# Patient Record
Sex: Female | Born: 1985 | Race: White | Hispanic: No | Marital: Single
Health system: Midwestern US, Community
[De-identification: ages and names within clinical notes are randomized; demographics above are authoritative.]

## PROBLEM LIST (undated history)

## (undated) DIAGNOSIS — Z789 Other specified health status: Secondary | ICD-10-CM

## (undated) DIAGNOSIS — F988 Other specified behavioral and emotional disorders with onset usually occurring in childhood and adolescence: Secondary | ICD-10-CM

## (undated) HISTORY — PX: UMBILICAL HERNIA REPAIR: SHX196

---

## 2004-04-02 ENCOUNTER — Emergency Department (HOSPITAL_COMMUNITY): Admission: EM | Admit: 2004-04-02 | Discharge: 2004-04-02 | Payer: Self-pay | Admitting: Emergency Medicine

## 2006-09-26 IMAGING — CR DG CHEST 2V
2 series · 2 of 2 positions shown · non-contrast
Comparison: none

CLINICAL DATA: Chest pain.
 CHEST (TWO VIEWS)

 The heart size and mediastinal contours are normal. The lungs are clear. The visualized skeleton is unremarkable.

[view not recorded (1 of 2)]
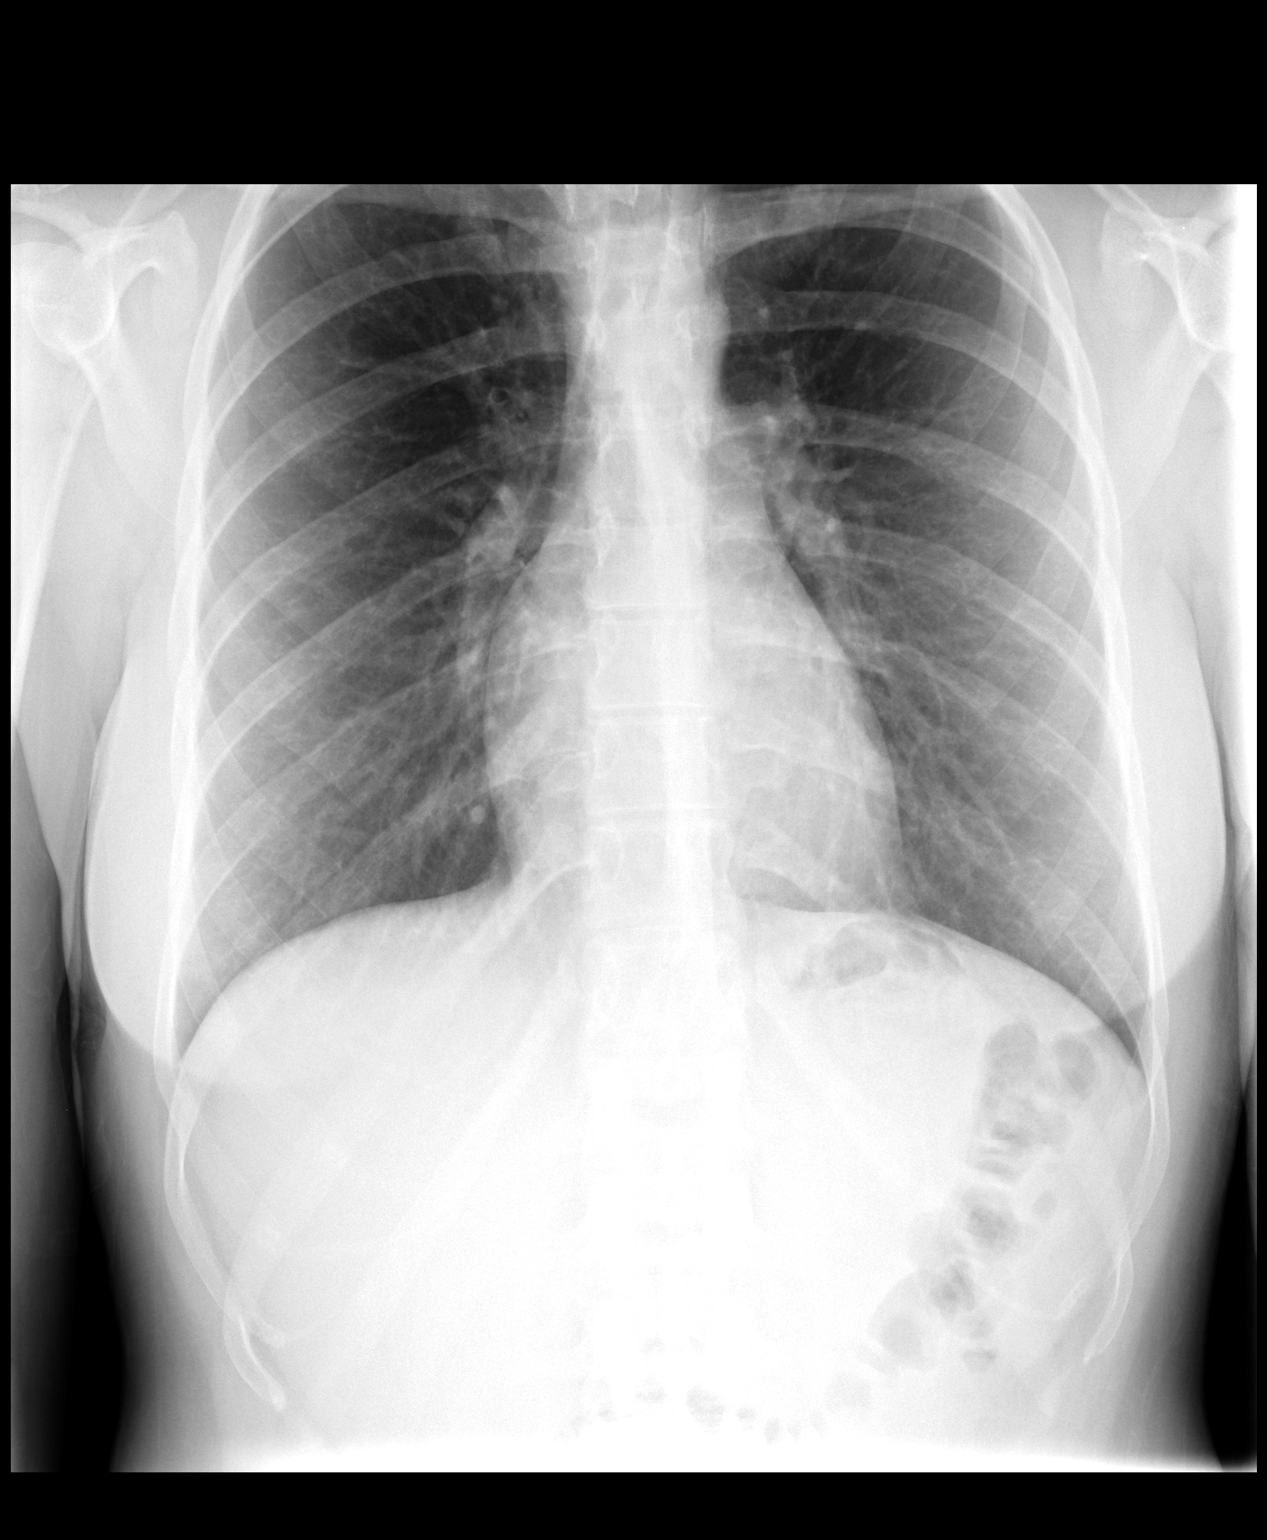

[view not recorded (2 of 2)]
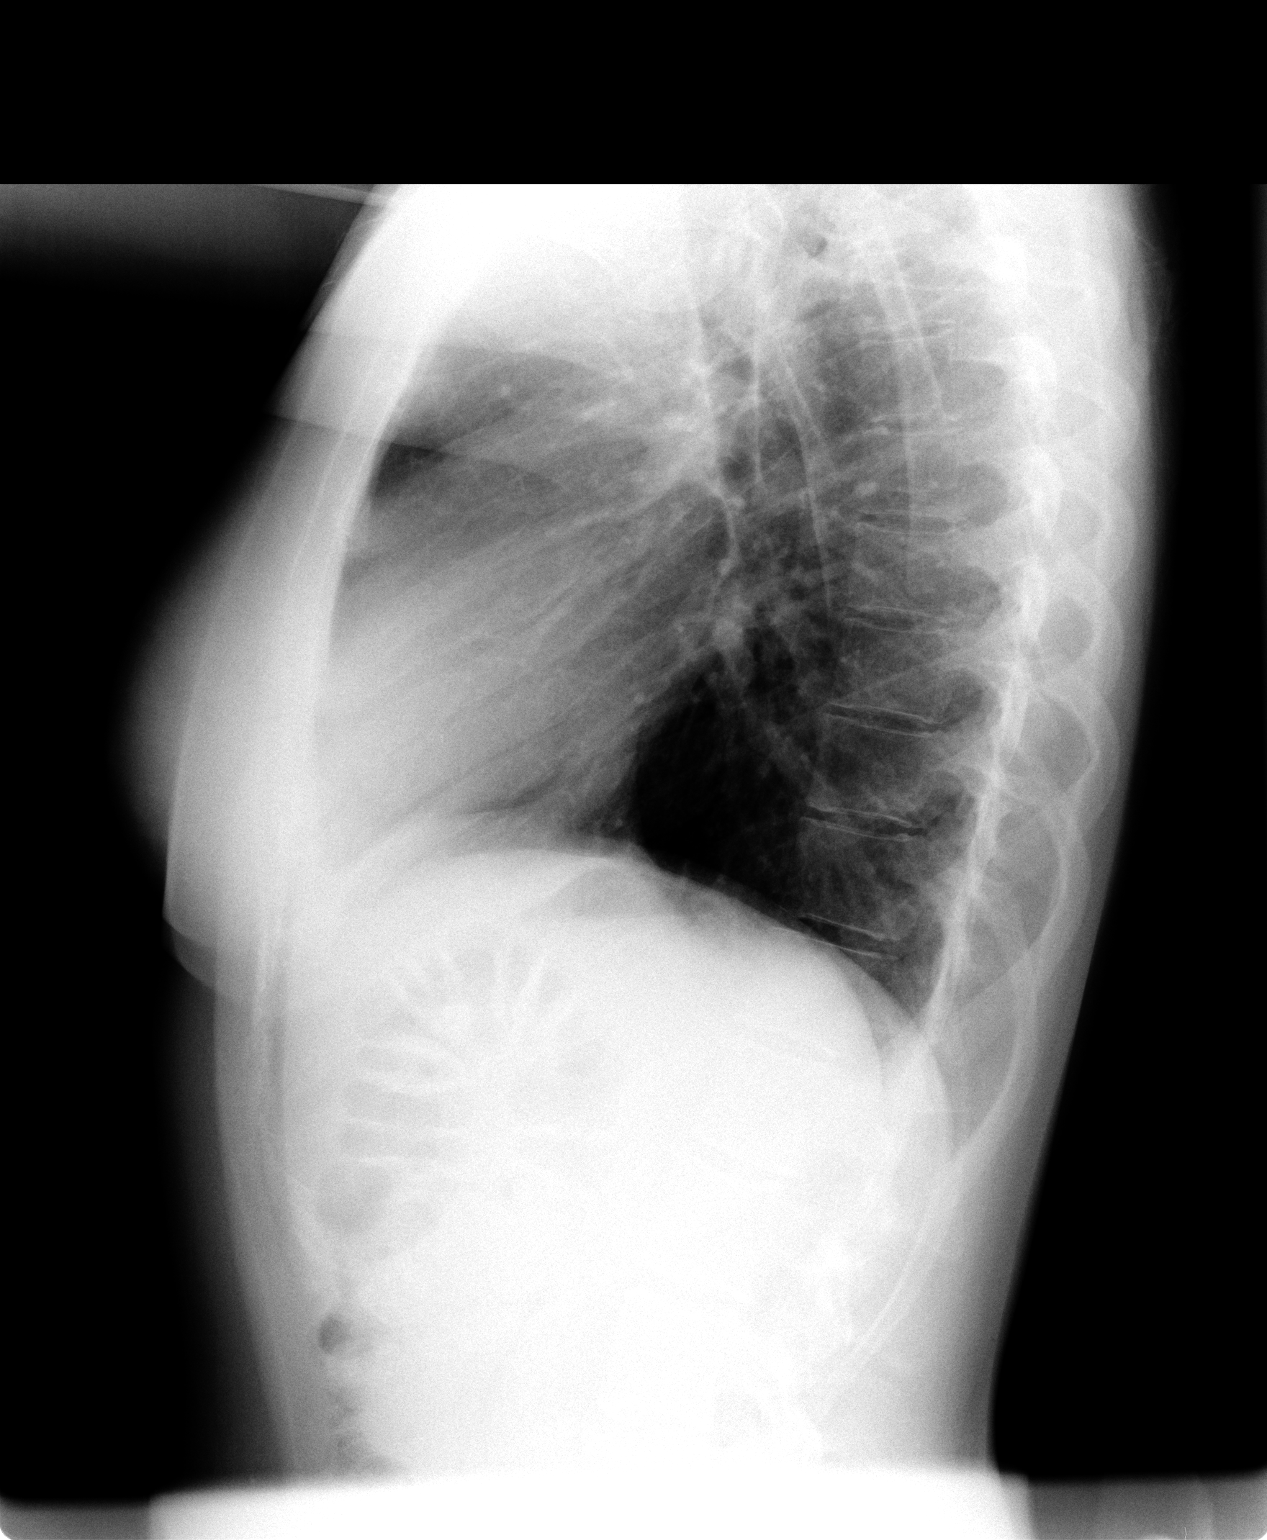

[2 of 2 positions shown; findings below may reference images not displayed]

IMPRESSION: No active disease.

## 2014-08-10 ENCOUNTER — Ambulatory Visit: Admit: 2014-08-10 | Discharge: 2014-08-10 | Payer: PRIVATE HEALTH INSURANCE | Attending: Family | Primary: Family

## 2014-08-10 DIAGNOSIS — F988 Other specified behavioral and emotional disorders with onset usually occurring in childhood and adolescence: Secondary | ICD-10-CM

## 2014-08-10 MED ORDER — AMPHETAMINE-DEXTROAMPHETAMINE 10 MG TAB
10 mg | ORAL_TABLET | Freq: Every day | ORAL | Status: DC
Start: 2014-08-10 — End: 2014-08-10

## 2014-08-10 MED ORDER — AMPHETAMINE-DEXTROAMPHETAMINE 10 MG TAB
10 mg | ORAL_TABLET | Freq: Every day | ORAL | Status: DC
Start: 2014-08-10 — End: 2014-11-16

## 2014-08-10 NOTE — Patient Instructions (Signed)
Attention Deficit Hyperactivity Disorder (ADHD) in Adults: Care Instructions  Your Care Instructions  Attention deficit hyperactivity disorder, or ADHD, is a condition that makes it hard to pay attention. So you may have problems when you try to focus, get organized, and finish tasks. It might make you more active than other people. Or you might do things without thinking first.  ADHD is very common. It usually starts in early childhood. Many adults don't realize they have it until their children are diagnosed. Then they become aware of their own symptoms.  Doctors don't know what causes ADHD. But it often runs in families.  ADHD can be treated with medicines, behavior training, and counseling. Treatment can improve your life.  Follow-up care is a key part of your treatment and safety. Be sure to make and go to all appointments, and call your doctor if you are having problems. It's also a good idea to know your test results and keep a list of the medicines you take.  How can you care for yourself at home?  ?? Learn all you can about ADHD. This will help you and your family understand it better.  ?? Take your medicines exactly as prescribed. Call your doctor if you think you are having a problem with your medicine. You will get more details on the specific medicines your doctor prescribes.  ?? If you miss a dose of your medicine, do not take an extra dose.  ?? If your doctor suggests counseling, find a counselor you like and trust. Talk openly and honestly. Be willing to make some changes.  ?? Find a support group for adults with ADHD. Talking to others with the same problems can help you feel better. It can also give you ideas about how to best cope with the condition.  ?? Get rid of distractions at your work space. Keep your desk clean. Try not to face a window or busy hallway.  ?? Use files, planners, and other tools to keep you organized.  ?? Limit use of alcohol, and do not use illegal drugs. People with ADHD  tend to become addicted more easily than others. Tell your doctor if you need help to quit. Counseling, support groups, and sometimes medicines can help you stay free of alcohol or drugs.  ?? Get at least 30 minutes of physical activity on most days of the week. Exercise has been shown to help people cope with ADHD. Walking is a good choice. You also may want to do other activities, such as running, swimming, cycling, or playing tennis or team sports.  When should you call for help?  Watch closely for changes in your health, and be sure to contact your doctor if:  ?? You feel sad a lot or cry all the time.  ?? You have trouble sleeping, or you sleep too much.  ?? You find it hard to concentrate, make decisions, or remember things.  ?? You change how you normally eat.  ?? You feel guilty for no reason.  Where can you learn more?  Go to http://www.healthwise.net/GoodHelpConnections  Enter B196 in the search box to learn more about "Attention Deficit Hyperactivity Disorder (ADHD) in Adults: Care Instructions."  ?? 2006-2016 [employer]. Care instructions adapted under license by [employer] (which disclaims liability or warranty for this information). This care instruction is for use with your licensed healthcare professional. If you have questions about a medical condition or this instruction, always ask your healthcare professional. Healthwise, Incorporated disclaims any warranty   or liability for your use of this information.  Content Version: 10.9.538570; Current as of: January 01, 2014

## 2014-08-11 NOTE — Progress Notes (Signed)
Chief Complaint   Patient presents with   ??? Establish Care     r#2   ??? Other     re start adderall       HPI:  Kerri Burton is a 29 y.o. female presenting to the office today for establishment. States she is doing well. 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 She recently moved back to the area after being in [place] for the past year. She has ADD and has been off of her medication for about 3 months. States she was diagnosed in highschool. She has issues with concentration and focus at work. She has issues remaining task oriented. Has been written up at work for forgetting to complete tasks. She denies having any medication side effects in the past. She denies shortness of breath, chest pain and palpitations. No irritability or insomnia. No dry mouth. No weight loss. She is without complaints today.     History reviewed. No pertinent past medical history.    History     Social History   ??? Marital Status: SINGLE     Spouse Name: N/A   ??? Number of Children: N/A   ??? 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 Smoking status: Never Smoker    ??? Smokeless tobacco: Never Used   ??? Alcohol Use: Yes      Comment: occ   ??? Drug Use: No   ??? Sexual Activity:     Partners: Male     Other Topics Concern   ??? 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        Family History   Problem Relation Age of Onset   ??? No Known Problems Mother    ??? Hypertension Father    ??? No Known Problems Sister    ??? No Known Problems Brother        Current Outpatient Prescriptions   Medication Sig Dispense Refill   ??? ibuprofen 100 mg tablet Take 100 mg by mouth every six (6) hours as needed for Pain.     ??? [START ON 10/10/2014] dextroamphetamine-amphetamine (ADDERALL) 10 mg tablet Take 1 Tab by mouth daily. Max Daily Amount: 10 mg. 30 Tab 0       Review of Systems - History obtained from the patient   General ROS: negative for - fatigue or fever  Psychological ROS: positive for - concentration difficulties  negative for - anxiety, depression or sleep disturbances  Endocrine ROS: negative for - malaise/lethargy or polydipsia/polyuria  Respiratory ROS: no cough, shortness of breath, or wheezing  Cardiovascular ROS: no chest pain or dyspnea on exertion  Gastrointestinal ROS: no abdominal pain, change in bowel habits, or black or bloody stools  Genito-Urinary ROS: no dysuria, trouble voiding, or hematuria  Musculoskeletal ROS: negative for - gait disturbance, muscle pain or muscular weakness  Neurological ROS: no TIA or stroke symptoms  Dermatological ROS: negative for - skin lesion changes    Visit Vitals   Item Reading   ??? BP 90/64 mmHg   ??? Temp(Src) 97.7 ??F (36.5 ??C) (Tympanic)   ??? Ht 5' 3.5" (1.613 m)   ??? Wt 152 lb (68.947 kg)   ??? BMI 26.50 kg/m2       Physical Examination: General appearance - alert, well appearing, and in no distress  Mental status - alert, oriented to person, place, and time, normal mood, behavior, speech, dress, motor activity, and thought processes  Neck - supple, no significant adenopathy  Chest - clear to auscultation, no wheezes, rales or rhonchi, symmetric air entry  Heart - normal rate, regular rhythm, normal S1, S2, no murmurs, rubs, clicks or gallops  Extremities - no pedal edema noted  Skin - normal coloration and turgor, no rashes, no suspicious skin lesions noted    Assessment and Plan:    1. ADD (attention deficit disorder)  Side effects reviewed in detail. Reminded about the possible side effects including but not limited to: anxiety, heart rhythm changes, insomnia, weight loss and memory changes. Patient was further reminded about the potentially addictive nature of the medications and that if any abuse or misuse/misappropriation were to be discovered that this would be grounds for discharge from the office.  Finally, patient understands need for  routine follow up and routine drug testing as needed.   - dextroamphetamine-amphetamine (ADDERALL) 10 mg tablet; Take 1 Tab by mouth daily. Max Daily Amount: 10 mg.  Dispense: 30 Tab; Refill: 0    2. BMI 26.0-26.9,adult  Long discussion with patient today regarding diet and exercise. Goal weight for patient is 130lbs.     3. Amenorrhea  States long history, rarely has cycles. Usually once a year. Has monthly bloating, cramping and moodiness. Uses condoms for birth control. States was told she may have PCOS and/or Endometriosis but was never confirmed. Patient wishes to follow up for labs/referral.       Myer HaffAyesha S Hinton-Nutt, NP

## 2014-11-16 ENCOUNTER — Ambulatory Visit: Admit: 2014-11-16 | Discharge: 2014-11-16 | Payer: BLUE CROSS/BLUE SHIELD | Attending: Family | Primary: Family

## 2014-11-16 DIAGNOSIS — F988 Other specified behavioral and emotional disorders with onset usually occurring in childhood and adolescence: Secondary | ICD-10-CM

## 2014-11-16 LAB — AMB POC URINE PREGNANCY TEST, VISUAL COLOR COMPARISON: HCG urine, Ql. (POC): NEGATIVE

## 2014-11-16 MED ORDER — AMPHETAMINE-DEXTROAMPHETAMINE 10 MG TAB
10 mg | ORAL_TABLET | Freq: Every day | ORAL | 0 refills | Status: DC
Start: 2014-11-16 — End: 2014-11-23

## 2014-11-16 MED ORDER — AMPHETAMINE-DEXTROAMPHETAMINE 10 MG TAB
10 mg | ORAL_TABLET | Freq: Every day | ORAL | 0 refills | Status: DC
Start: 2014-11-16 — End: 2014-11-16

## 2014-11-16 NOTE — Progress Notes (Signed)
Chief Complaint   Patient presents with   ??? Medication Refill     adderall r#2       HPI:  Kerri Burton is a 29 y.o. female presenting to the office today for follow up. She had ADD. States she is doing well. Feels that her symptoms have improved once she was back on her medication. States she is taking her medication regularly as directed. She is able to stay on task and focused at work. She is able to accomplish her work in a timely manner. She denies any medication side effects. No agitation or irritability. No insomnia. No anxiety. No shortness of breath, chest pain or palpitations. She has lost 4 pounds since her last visit as she has been exercising. Also, she continues to not have a period. Unsure of last cycle, at least before her last office visit 3 months ago. Reports this has been an issue for a very long time. She is sexually active and does not use contraceptive. We will recheck urine hcg today. We will send her back to GYN for further evaluation and testing. She is also in need of GYN exam. She denies issues or complaints today.     History reviewed. No pertinent past medical history.    Social History     Social History   ??? Marital status: SINGLE     Spouse name: N/A   ??? Number of children: N/A   ??? 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 Smoking status: Never Smoker   ??? Smokeless tobacco: Never Used   ??? Alcohol use 2.4 oz/week     3 Glasses of wine, 0 Cans of beer, 1 Shots of liquor per week      Comment: occ   ??? Drug use: No   ??? Sexual activity: Yes     Partners: Male     Birth control/ protection: None     Other Topics Concern   ??? 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        Family History   Problem Relation Age of Onset   ??? No Known Problems Mother    ??? Hypertension Father    ??? No Known Problems Sister    ??? No Known Problems Brother        Current Outpatient Prescriptions    Medication Sig Dispense Refill   ??? [START ON 01/20/2015] dextroamphetamine-amphetamine (ADDERALL) 10 mg tablet Take 1 Tab by mouth daily. Max Daily Amount: 10 mg. 30 Tab 0   ??? ibuprofen 100 mg tablet Take 100 mg by mouth every six (6) hours as needed for Pain.         Review of Systems - see HPI    Visit Vitals   ??? BP 98/62   ??? Temp 96.2 ??F (35.7 ??C) (Tympanic)   ??? Ht 5' 3.5" (1.613 m)   ??? Wt 148 lb (67.1 kg)   ??? BMI 25.81 kg/m2       Physical Examination: General appearance - alert, well appearing, and in no distress  Mental status - normal mood, behavior, speech, dress, motor activity, and thought processes  Neck - supple, no significant adenopathy  Chest - clear to auscultation, no wheezes, rales or rhonchi, symmetric air entry  Heart - normal rate, regular rhythm, normal S1, S2, no  murmurs, rubs, clicks or gallops  Neurological - alert, oriented, normal speech, no focal findings or movement disorder noted  Extremities - no pedal edema noted  Skin - normal coloration and turgor    Assessment and Plan:    1. ADD (attention deficit disorder)  Appears to be satisfactorily responding to the treatment. No apparent side effects.  Reminded about the possible side effects including but not limited to: anxiety, heart rhythm changes, insomnia, weight loss and memory changes. Patient was further reminded about the potentially addictive nature of the medications and that if any abuse or misuse/misappropriation were to be discovered that this would be grounds for discharge from the office.  Finally, patient understands need for routine follow up.    - dextroamphetamine-amphetamine (ADDERALL) 10 mg tablet; Take 1 Tab by mouth daily. Max Daily Amount: 10 mg.  Dispense: 30 Tab; Refill: 0    Provider Statement: To the best of my knowledge, patient is taking controlled medications properly and not abusing them.     2. Amenorrhea  - Pregnancy Urine (16109)  - REFERRAL TO GYNECOLOGY      Myer Haff, NP

## 2014-11-16 NOTE — Patient Instructions (Addendum)
Secondary Amenorrhea: Care Instructions  Your Care Instructions  Amenorrhea means you do not have menstrual periods. There are two types. Primary amenorrhea means you never start your periods. Secondary amenorrhea means you have had periods, and then they stop, especially for more than 3 months.  Even if you don't have periods, you could still get pregnant.  You may not know what caused your periods to stop. Possible causes include pregnancy, hormonal changes, and losing or gaining a lot of weight quickly. Some medicines and stress could also cause it.  Being active in endurance sports can also cause you to miss your period or stop menstruating. Female athletes may try to lose or maintain weight in harmful ways. These include dieting too much or binging and purging. But doing these things can lead to eating disorders, amenorrhea, and osteoporosis. If you exercise less or gain a little weight, your periods will probably start again.  Your doctor may order tests to find out why your periods have stopped. Your doctor may give you the hormone progestin. It can cause you to have a period.  Talk to your doctor if you do not have a period for 3 months or more. Going for a long amount of time without a period can raise your chance of getting cancer of the lining of the uterus later in life.  Follow-up care is a key part of your treatment and safety. Be sure to make and go to all appointments, and call your doctor if you are having problems. It's also a good idea to know your test results and keep a list of the medicines you take.  How can you care for yourself at home?  ?? Eat a healthy, balanced diet. This includes fruits, vegetables, whole grains, proteins, and low-fat dairy products.  ?? Do light exercise, unless your doctor told you not to exercise.  ?? Use birth control if you do not want to get pregnant.  When should you call for help?  Watch closely for changes in your health, and be sure to contact your doctor if:   ?? You think you might be pregnant.  ?? You have very heavy bleeding after not having had your period for several months.  Where can you learn more?  Go to http://www.healthwise.net/GoodHelpConnections  Enter E177 in the search box to learn more about "Secondary Amenorrhea: Care Instructions."  ?? 2006-2016 [employer]. Care instructions adapted under license by [employer] (which disclaims liability or warranty for this information). This care instruction is for use with your licensed healthcare professional. If you have questions about a medical condition or this instruction, always ask your healthcare professional. [employer]wise, Inc[employer] disclaims any warranty or liability for your use of this information.  Content Version: 11.0.578772; Current as of: April 08, 2014

## 2014-11-23 ENCOUNTER — Telehealth

## 2014-11-23 MED ORDER — AMPHETAMINE-DEXTROAMPHETAMINE 12.5 MG TAB
12.5 mg | ORAL_TABLET | Freq: Every day | ORAL | 0 refills | Status: DC
Start: 2014-11-23 — End: 2014-11-23

## 2014-11-23 MED ORDER — AMPHETAMINE-DEXTROAMPHETAMINE 12.5 MG TAB
12.5 mg | ORAL_TABLET | Freq: Every day | ORAL | 0 refills | Status: DC
Start: 2014-11-23 — End: 2015-01-03

## 2014-11-23 NOTE — Telephone Encounter (Signed)
Let's try 12.5 mg which is the next step up. That way we are staying with the lowest effective dose. Please let us know how she is responding to this medication and we can go form there.

## 2014-11-23 NOTE — Telephone Encounter (Signed)
Left message with below info

## 2014-11-23 NOTE — Telephone Encounter (Signed)
lvm that you saw her last week and you told her that she could increase her adderall dose to 15 mg from 10 mg to see if it helped.  Pt states that it did help and she would a 15 mg prescription.  She dropped off her 10 mg prescriptions up front

## 2014-11-29 ENCOUNTER — Encounter: Attending: Obstetrics & Gynecology | Primary: Family

## 2014-12-31 NOTE — Progress Notes (Signed)
Yes. Thank you

## 2014-12-31 NOTE — Progress Notes (Signed)
Per chart patient NOS her appointment with [facility].  Can I close referral?

## 2015-01-03 ENCOUNTER — Encounter

## 2015-01-03 MED ORDER — AMPHETAMINE-DEXTROAMPHETAMINE 12.5 MG TAB
12.5 mg | ORAL_TABLET | Freq: Every day | ORAL | 0 refills | Status: DC
Start: 2015-01-03 — End: 2015-01-03

## 2015-01-03 MED ORDER — AMPHETAMINE-DEXTROAMPHETAMINE 12.5 MG TAB
12.5 mg | ORAL_TABLET | Freq: Every day | ORAL | 0 refills | Status: DC
Start: 2015-01-03 — End: 2015-03-30

## 2015-01-03 NOTE — Telephone Encounter (Signed)
lvm that she needs a refill on her 12.5mg  adderall

## 2015-01-03 NOTE — Telephone Encounter (Signed)
RX printed x 2 and post dated. OK to pick up.

## 2015-01-04 NOTE — Telephone Encounter (Signed)
Pt notified and voiced understanding

## 2015-02-18 ENCOUNTER — Encounter: Attending: Family | Primary: Family

## 2015-03-30 ENCOUNTER — Ambulatory Visit: Admit: 2015-03-30 | Discharge: 2015-03-30 | Payer: BLUE CROSS/BLUE SHIELD | Attending: Family | Primary: Family

## 2015-03-30 DIAGNOSIS — F988 Other specified behavioral and emotional disorders with onset usually occurring in childhood and adolescence: Secondary | ICD-10-CM

## 2015-03-30 MED ORDER — AMPHETAMINE-DEXTROAMPHETAMINE 15 MG TAB
15 mg | ORAL_TABLET | Freq: Every day | ORAL | 0 refills | Status: DC
Start: 2015-03-30 — End: 2015-09-02

## 2015-03-30 MED ORDER — AMPHETAMINE-DEXTROAMPHETAMINE 15 MG TAB
15 mg | ORAL_TABLET | Freq: Every day | ORAL | 0 refills | Status: DC
Start: 2015-03-30 — End: 2015-03-30

## 2015-03-30 NOTE — Progress Notes (Signed)
Chief Complaint   Patient presents with   ??? Other     consult on adderall.  r#2       HPI:  Kerri Burton is a 30 y.o. female presenting to the office today for follow up. States she is doing well. She had ADD. She is taking medication as directed without issue or side effect. She has increased focus and concentration. She does take drug holidays occasionally on the weekend. She denies any medication side effects. No shortness of breath, chest pain or palpitations. No agitation or irritability. No dry mouth. No insomnia. No weight loss. She would like to go to  tablets (currently 12.5mg ) due to pharmacy availability. States she has issues getting RX filled.     History reviewed. No pertinent past medical history.    Social History     Social History   ??? Marital status: SINGLE     Spouse name: N/A   ??? Number of children: N/A   ??? 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 Smoking status: Never Smoker   ??? Smokeless tobacco: Never Used   ??? Alcohol use 2.4 oz/week     3 Glasses of wine, 0 Cans of beer, 1 Shots of liquor per week      Comment: occ   ??? Drug use: No   ??? Sexual activity: Yes     Partners: Male     Birth control/ protection: None     Other Topics Concern   ??? 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        Family History   Problem Relation Age of Onset   ??? No Known Problems Mother    ??? Hypertension Father    ??? No Known Problems Sister    ??? No Known Problems Brother        Current Outpatient Prescriptions   Medication Sig Dispense Refill   ??? [START ON 05/28/2015] dextroamphetamine-amphetamine (ADDERALL) 15 mg tablet Take 1 Tab (15 mg total) by mouth dailyEarliest Fill Date: 05/28/15.  Max Daily Amount: 15 mg 30 Tab 0   ??? ibuprofen 100 mg tablet Take 100 mg by mouth every six (6) hours as needed for Pain.       Review of Systems - History obtained from the patient   General ROS: negative for - fatigue, sleep disturbance, weight gain or weight loss  Psychological ROS: positive for - concentration difficulties  negative for - anxiety, depression, sleep disturbances or suicidal ideation  Endocrine ROS: negative for - polydipsia/polyuria  Respiratory ROS: no cough, shortness of breath, or wheezing  Cardiovascular ROS: no chest pain or dyspnea on exertion    Visit Vitals   ??? BP 98/62   ??? Temp 98.4 ??F (36.9 ??C) (Tympanic)   ??? Ht 5' 3.5" (1.613 m)   ??? Wt 147 lb (66.7 kg)   ??? BMI 25.63 kg/m2       Physical Examination: General appearance - alert, well appearing, and in no distress  Mental status - alert, oriented to person, place, and time, normal mood, behavior, speech, dress, motor activity, and thought processes  Neck - supple, no significant adenopathy  Chest - clear to auscultation, no wheezes, rales or rhonchi, symmetric air entry  Heart - normal rate, regular rhythm, normal S1, S2, no murmurs, rubs, clicks or  gallops  Neurological - alert, oriented, normal speech, no focal findings or movement disorder noted  Extremities - no pedal edema noted  Skin - normal coloration and turgor, no rashes, no suspicious skin lesions noted    Assessment and Plan:    1. ADD (attention deficit disorder)  Appears to be satisfactorily responding to the treatment. No apparent side effects.  Reminded about the possible side effects including but not limited to: anxiety, heart rhythm changes, insomnia, weight loss and memory changes. Patient was further reminded about the potentially addictive nature of the medications and that if any abuse or misuse/misappropriation were to be discovered that this would be grounds for discharge from the office.  Finally, patient understands need for routine follow up.  [facility] reviewed. Last filled 02/18/2015. To the best of my knowledge, patient is taking controlled medications properly and not abusing them. RX printed x 3 and post dated 03/30/2015, 04/27/2015, and  05/28/2015.   - dextroamphetamine-amphetamine (ADDERALL) 15 mg tablet; Take 1 Tab (15 mg total) by mouth dailyEarliest Fill Date: 05/28/15.  Max Daily Amount: 15 mg  Dispense: 30 Tab; Refill: 0      Myer Haff, NP

## 2015-03-30 NOTE — Patient Instructions (Addendum)
Polycystic Ovary Syndrome: Care Instructions  Your Care Instructions  Polycystic ovary syndrome, or PCOS, means a woman's hormones are out of balance. It can cause problems with your periods and make it hard to get pregnant.  Doctors don't know for sure what causes PCOS, but it seems to run in families. It also seems to be linked to obesity and a risk for diabetes. If you have PCOS, your sisters and daughters have a higher chance of getting it too.  You may have other symptoms. These include weight gain, acne, too much hair growth on the face or body, high blood pressure, and high blood sugar. Your ovaries may have cysts on them. These cysts are growths filled with fluid.  Keep in mind that although you may not have regular periods, you can still get pregnant. Talk to your doctor about birth control if you do not want to get pregnant. Sometimes the hormone changes with PCOS can also make it hard for some women to get pregnant. If this is a concern, talk to your doctor about treatment for this problem.  Women who have PCOS can go for months or longer with no period. Your doctor may recommend medicines that can help get your cycles back to normal.  Follow-up care is a key part of your treatment and safety. Be sure to make and go to all appointments, and call your doctor if you are having problems. It's also a good idea to know your test results and keep a list of the medicines you take.  How can you care for yourself at home?  ?? Take your medicines exactly as prescribed. Call your doctor if you think you are having a problem with your medicine.  ?? Eat a healthy diet. Include fruits, vegetables, beans, and whole grains in your diet each day.  ?? If you are overweight, losing weight can help with many of the symptoms of PCOS. Talk to your doctor about safe ways to lose weight.  ?? Get at least 30 minutes of exercise on most days of the week. Walking is  a good choice. Or you can run, swim, cycle, or play tennis or team sports.  ?? For hair growth you don't want, try bleaching, plucking, electrolysis, or laser therapy.  ?? Acne can be treated with over-the-counter medicines. Look for ones that have benzoyl peroxide or salicylic acid in them.  When should you call for help?  Call your doctor now or seek immediate medical care if:  ?? You have severe vaginal bleeding. This means that you are soaking through your usual pads or tampons each hour for 2 or more hours.  Watch closely for changes in your health, and be sure to contact your doctor if:  ?? You have more vaginal bleeding, or bleeding is more irregular.  Where can you learn more?  Go to http://www.healthwise.net/GoodHelpConnections.  Enter K559 in the search box to learn more about "Polycystic Ovary Syndrome: Care Instructions."  Current as of: April 08, 2014  Content Version: 11.1  ?? 2006-2016 [employer]. Care instructions adapted under license by [employer] (which disclaims liability or warranty for this information). If you have questions about a medical condition or this instruction, always ask your healthcare professional. [employer], [employer] disclaims any warranty or liability for your use of this information.

## 2015-09-02 ENCOUNTER — Encounter: Attending: Family | Primary: Family

## 2015-09-02 ENCOUNTER — Ambulatory Visit: Admit: 2015-09-02 | Discharge: 2015-09-02 | Attending: Family | Primary: Family

## 2015-09-02 DIAGNOSIS — F988 Other specified behavioral and emotional disorders with onset usually occurring in childhood and adolescence: Secondary | ICD-10-CM

## 2015-09-02 MED ORDER — AMPHETAMINE-DEXTROAMPHETAMINE 15 MG TAB
15 mg | ORAL_TABLET | Freq: Every day | ORAL | 0 refills | Status: DC
Start: 2015-09-02 — End: 2015-10-11

## 2015-09-02 NOTE — Patient Instructions (Addendum)
Attention Deficit Hyperactivity Disorder (ADHD) in Adults: Care Instructions  Your Care Instructions  Attention deficit hyperactivity disorder, or ADHD, is a condition that makes it hard to pay attention. So you may have problems when you try to focus, get organized, and finish tasks. It might make you more active than other people. Or you might do things without thinking first.  ADHD is very common. It usually starts in early childhood. Many adults don't realize they have it until their children are diagnosed. Then they become aware of their own symptoms.  Doctors don't know what causes ADHD. But it often runs in families.  ADHD can be treated with medicines, behavior training, and counseling. Treatment can improve your life.  Follow-up care is a key part of your treatment and safety. Be sure to make and go to all appointments, and call your doctor if you are having problems. It's also a good idea to know your test results and keep a list of the medicines you take.  How can you care for yourself at home?  ?? Learn all you can about ADHD. This will help you and your family understand it better.  ?? Take your medicines exactly as prescribed. Call your doctor if you think you are having a problem with your medicine. You will get more details on the specific medicines your doctor prescribes.  ?? If you miss a dose of your medicine, do not take an extra dose.  ?? If your doctor suggests counseling, find a counselor you like and trust. Talk openly and honestly. Be willing to make some changes.  ?? Find a support group for adults with ADHD. Talking to others with the same problems can help you feel better. It can also give you ideas about how to best cope with the condition.  ?? Get rid of distractions at your work space. Keep your desk clean. Try not to face a window or busy hallway.  ?? Use files, planners, and other tools to keep you organized.  ?? Limit use of alcohol, and do not use illegal drugs. People with ADHD  tend to become addicted more easily than others. Tell your doctor if you need help to quit. Counseling, support groups, and sometimes medicines can help you stay free of alcohol or drugs.  ?? Get at least 30 minutes of physical activity on most days of the week. Exercise has been shown to help people cope with ADHD. Walking is a good choice. You also may want to do other activities, such as running, swimming, cycling, or playing tennis or team sports.  When should you call for help?  Watch closely for changes in your health, and be sure to contact your doctor if:  ?? You feel sad a lot or cry all the time.  ?? You have trouble sleeping, or you sleep too much.  ?? You find it hard to concentrate, make decisions, or remember things.  ?? You change how you normally eat.  ?? You feel guilty for no reason.  Where can you learn more?  Go to http://www.healthwise.net/[employer].  Enter B196 in the search box to learn more about "Attention Deficit Hyperactivity Disorder (ADHD) in Adults: Care Instructions."  Current as of: September 07, 2014  Content Version: 11.3  ?? 2006-2017 Healthwise[employer] Inc[employer]. Care instructions adapted under license by [employer] (which disclaims liability or warranty for this information). If you have questions about a medical condition or this instruction, always ask your healthcare professional. Healthwise, Incorporated disclaims any warranty   or liability for your use of this information.

## 2015-09-02 NOTE — Progress Notes (Signed)
Chief Complaint   Patient presents with   ??? Follow-up     ADD.  r#2       HPI:  Kerri Burton is a 30 y.o. female presenting to the office today for follow up. States she is doing well. She has ADD. States she takes medication as directed. She does not take medication on weekends and holidays. States symptoms are well controlled. She does have increased focus, concentration and productivity at work. She reports being able to stay on task. She denies any medication side effects. No shortness of breath, chest pain or palpitations. No agitation or irritability. No insomnia. No anxiety. She complains of an afternoon "crash" around 4-5 pm. Discussed alternate options such as XR and she will think about it. She is without complaints.     History reviewed. No pertinent past medical history.    Social History     Social History   ??? Marital status: SINGLE     Spouse name: N/A   ??? Number of children: N/A   ??? 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 Smoking status: Never Smoker   ??? Smokeless tobacco: Never Used   ??? Alcohol use 2.4 oz/week     3 Glasses of wine, 0 Cans of beer, 1 Shots of liquor per week      Comment: occ   ??? Drug use: No   ??? Sexual activity: Yes     Partners: Male     Birth control/ protection: None     Other Topics Concern   ??? 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        Family History   Problem Relation Age of Onset   ??? No Known Problems Mother    ??? Hypertension Father    ??? No Known Problems Sister    ??? No Known Problems Brother        Current Outpatient Prescriptions   Medication Sig Dispense Refill   ??? dextroamphetamine-amphetamine (ADDERALL) 15 mg tablet Take 1 Tab (15 mg total) by mouth daily.  Max Daily Amount: 15 mg 30 Tab 0   ??? ibuprofen 100 mg tablet Take 100 mg by mouth every six (6) hours as needed for Pain.         Review of Systems - History obtained from the patient   General ROS: negative for - fatigue, fever or weight loss  Psychological ROS: positive for - concentration difficulties  negative for - anxiety, depression, sleep disturbances or suicidal ideation  Respiratory ROS: no cough, shortness of breath, or wheezing  Cardiovascular ROS: no chest pain or dyspnea on exertion    Vitals:    09/02/15 1232   BP: 102/64   Temp: 98 ??F (36.7 ??C)   TempSrc: Tympanic   Weight: 156 lb (70.8 kg)   Height: 5' 3.5" (1.613 m)       Physical Examination: General appearance - alert, well appearing, and in no distress  Mental status - alert, oriented to person, place, and time, normal mood, behavior, speech, dress, motor activity, and thought processes  Neck - supple, no significant adenopathy  Chest - clear to auscultation, no wheezes, rales or rhonchi, symmetric air entry  Heart - normal rate, regular rhythm, normal S1, S2, no murmurs, rubs, clicks or gallops  Neurological - alert, oriented, normal  speech, no focal findings or movement disorder noted  Extremities - intact peripheral pulses  Skin - normal coloration and turgor, no rashes, no suspicious skin lesions noted    Assessment and Plan:    1. ADD (attention deficit disorder)  Appears to be satisfactorily responding to the treatment. No apparent side effects.  Reminded about the possible side effects including but not limited to: anxiety, heart rhythm changes, insomnia, weight loss and memory changes. Patient was further reminded about the potentially addictive nature of the medications and that if any abuse or misuse/misappropriation were to be discovered that this would be grounds for discharge from the office.  Finally, patient understands need for routine follow up.  [facility] reviewed, last filled 06/28/15. To the best of my knowledge, patient is taking controlled medications properly and not abusing them. RX printed x 1.   - dextroamphetamine-amphetamine (ADDERALL) 15 mg tablet; Take 1 Tab (15 mg  total) by mouth daily.  Max Daily Amount: 15 mg  Dispense: 30 Tab; Refill: 0      Myer Haff, NP

## 2015-10-11 ENCOUNTER — Encounter

## 2015-10-11 MED ORDER — AMPHETAMINE-DEXTROAMPHETAMINE 15 MG TAB
15 mg | ORAL_TABLET | Freq: Every day | ORAL | 0 refills | Status: DC
Start: 2015-10-11 — End: 2015-11-25

## 2015-10-11 NOTE — Telephone Encounter (Signed)
Notified Rx up front for pick up

## 2015-10-11 NOTE — Telephone Encounter (Signed)
RX printed x 1. [facility] reviewed, last filled 09/02/15. Ok to pick up.

## 2015-11-25 MED ORDER — AMPHETAMINE-DEXTROAMPHETAMINE 15 MG TAB
15 mg | ORAL_TABLET | Freq: Every day | ORAL | 0 refills | Status: DC
Start: 2015-11-25 — End: 2016-01-20

## 2015-11-25 NOTE — Telephone Encounter (Signed)
Called and spoke with patinet

## 2015-11-25 NOTE — Telephone Encounter (Signed)
RX printed x 1. [facility] reviewed, last filled 10/11/15.

## 2015-12-30 ENCOUNTER — Encounter: Attending: Family | Primary: Family

## 2016-01-13 ENCOUNTER — Encounter: Attending: Family | Primary: Family

## 2016-01-16 NOTE — Telephone Encounter (Signed)
Pt has a question regarding some type of diet, and possible interactions with some medications. [phone]

## 2016-01-16 NOTE — Telephone Encounter (Signed)
Left voicemail to notify patient.

## 2016-01-16 NOTE — Telephone Encounter (Signed)
Pt is interested in doing the HCG diet. She is wanting to know if you do the HCG diet. She would like to know prior to picking up prescription for Adderall. Says she will be stopping her script if starting on the diet. Please advise.

## 2016-01-16 NOTE — Telephone Encounter (Signed)
Ms. Kerri Burton has missed two scheduled appointments with me for follow up on her ADD and Adderall prescription. She will need to be seen if she wants to continue Adderall.     We do not offer any HCG diet program.

## 2016-01-20 ENCOUNTER — Ambulatory Visit: Admit: 2016-01-20 | Discharge: 2016-01-20 | Attending: Family | Primary: Family

## 2016-01-20 DIAGNOSIS — F988 Other specified behavioral and emotional disorders with onset usually occurring in childhood and adolescence: Secondary | ICD-10-CM

## 2016-01-20 MED ORDER — AMPHETAMINE-DEXTROAMPHETAMINE 15 MG TAB
15 mg | ORAL_TABLET | Freq: Every day | ORAL | 0 refills | Status: DC
Start: 2016-01-20 — End: 2016-02-24

## 2016-01-20 NOTE — Progress Notes (Signed)
Chief Complaint   Patient presents with   ??? Attention Deficit Disorder   ??? Medication Refill       HPI:  Kerri Burton is a 30 y.o. female presenting to the office today for continued care of ADD.  Patient has been on medication and denies any side effects such as headache, palpitations, weight loss, emotional changes, insomnia. Medication is working and performance while on medication is good.  She is taking 15mg  adderall xr.  States that takes days off during the weekends.   States that typically takes in the morning.   History reviewed. No pertinent past medical history.    Social History     Social History   ??? Marital status: SINGLE     Spouse name: N/A   ??? Number of children: N/A   ??? 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 Smoking status: Never Smoker   ??? Smokeless tobacco: Never Used   ??? Alcohol use 2.4 oz/week     3 Glasses of wine, 0 Cans of beer, 1 Shots of liquor per week      Comment: occ   ??? Drug use: No   ??? Sexual activity: Yes     Partners: Male     Birth control/ protection: None     Other Topics Concern   ??? 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        Allergies   Allergen Reactions   ??? Clindamycin Other (comments)     Red bumps   ??? Codeine Itching       Family History   Problem Relation Age of Onset   ??? No Known Problems Mother    ??? Hypertension Father    ??? No Known Problems Sister    ??? No Known Problems Brother        Current Outpatient Prescriptions on File Prior to Visit   Medication Sig Dispense Refill   ??? dextroamphetamine-amphetamine (ADDERALL) 15 mg tablet Take 1 Tab (15 mg total) by mouth dailyEarliest Fill Date: 11/25/15.  Max Daily Amount: 15 mg 30 Tab 0   ??? ibuprofen 100 mg tablet Take 100 mg by mouth every six (6) hours as needed for Pain.       No current facility-administered medications on file prior to visit.        Review of Systems -    General ROS: negative for - fatigue, sleep disturbance or weight loss  Cardiovascular ROS: no chest pain or dyspnea on exertion  Gastrointestinal ROS: no abdominal pain, change in bowel habits, or black or bloody stools  Neurological ROS: negative for - behavioral changes, headaches or tremors      Visit Vitals   ??? BP 101/72   ??? Temp 99.3 ??F (37.4 ??C)   ??? Resp 16   ??? Ht 5' 3.39" (1.61 m)   ??? Wt 153 lb (69.4 kg)   ??? BMI 26.77 kg/m2         Exam:  HEENT: Atraumatic Normocephalic. Extraocular movements intact, pupils equal/round/light reactive.  Nasopharynx clear with no obstruction or inflammatory changes.  Oropharynx is unremarkable  Neck: supple with Free Range of Motion  Lungs: Clear to ascultation  Heart: regular rate and rhythm with No murmur  Abdomen: Soft + bowel sounds, no organomegaly tenderness rebound or guarding  Musculoskeletal:  No deformity ROM within normal limits.  Extremities: no cyanosis clubbing or edema  Peripheral vascular: pulses 2+ equal in upper and lower extremities   Neurologic: non focal exam      Assessment/plan:    1. Attention deficit disorder, unspecified hyperactivity presence  Medication refilled, dhec cleared, signed new controlled substance policy.       ADD/ADHD- Appears to be satisfactorily responding to the treatment. No apparent side effects.  Reminded about the possible side effects including but not limited to: anxiety, heart rhythm changes, insomnia, weight loss and memory changes. Patient was further reminded about the potentially addictive nature of the medications and that if any abuse or misuse/misappropriation were to be discovered that this would be grounds for discharge from the office.  Finally, patient understands need for routine follow up.      Earney HamburgMatthew T Gavriela Cashin, NP

## 2016-02-24 ENCOUNTER — Telehealth

## 2016-02-24 MED ORDER — AMPHETAMINE-DEXTROAMPHETAMINE 15 MG TAB
15 mg | ORAL_TABLET | Freq: Every day | ORAL | 0 refills | Status: DC
Start: 2016-02-24 — End: 2016-03-30

## 2016-02-24 NOTE — Telephone Encounter (Signed)
Needs refill on Adderall.     [facility] reviewed. Last filled 01/20/16.     [facility] and find no evidence of improprieties in regards to patient's use and prescription history.     RX printed x 1.

## 2016-03-30 ENCOUNTER — Encounter

## 2016-03-30 MED ORDER — AMPHETAMINE-DEXTROAMPHETAMINE 15 MG TAB
15 mg | ORAL_TABLET | Freq: Every day | ORAL | 0 refills | Status: AC
Start: 2016-03-30 — End: ?

## 2016-03-30 NOTE — Telephone Encounter (Signed)
Pt notified that she needs an appt before she can get more refills.  She said that [place] was [place].  I told her to fill out a record transfer form when she picks up her rx so her new dr can see her records

## 2016-03-30 NOTE — Telephone Encounter (Signed)
RX printed x 1.     Needs appt next month.    [facility] and find no evidence of improprieties in regards to patient's use and prescription history. Last filled 02/25/16.

## 2020-01-29 DIAGNOSIS — N879 Dysplasia of cervix uteri, unspecified: Secondary | ICD-10-CM | POA: Insufficient documentation

## 2022-09-19 ENCOUNTER — Ambulatory Visit: Payer: Commercial Managed Care - HMO | Admitting: Podiatry

## 2022-09-19 ENCOUNTER — Ambulatory Visit (INDEPENDENT_AMBULATORY_CARE_PROVIDER_SITE_OTHER): Payer: Commercial Managed Care - HMO

## 2022-09-19 DIAGNOSIS — M778 Other enthesopathies, not elsewhere classified: Secondary | ICD-10-CM

## 2022-09-19 DIAGNOSIS — M21611 Bunion of right foot: Secondary | ICD-10-CM | POA: Diagnosis not present

## 2022-09-19 DIAGNOSIS — M2062 Acquired deformities of toe(s), unspecified, left foot: Secondary | ICD-10-CM | POA: Diagnosis not present

## 2022-09-19 DIAGNOSIS — M2061 Acquired deformities of toe(s), unspecified, right foot: Secondary | ICD-10-CM | POA: Diagnosis not present

## 2022-09-19 NOTE — Progress Notes (Signed)
Chief Complaint  Patient presents with   Numbness    Numbness to the tip of bilateral 2nd toes. Not diabetic.    Foot Pain    C/o pain to the lateral aspect of left arch. Pain when she applies pressure to area or stand for long period of time.    Bunions    Bunion to right foot. Patient denies any pain to bunion at this time.     HPI: 37 y.o. female presents today with the above listed concerns.  Denies injury or bruising.  Denies recent change in shoe gear or activity.  History reviewed. No pertinent past medical history.  History reviewed. No pertinent surgical history.  Allergies  Allergen Reactions   Benadryl [Diphenhydramine] Other (See Comments)    Restless leg syndrome     Clindamycin/Lincomycin Hives     Physical Exam: There were no vitals filed for this visit.  General: The patient is alert and oriented x3 in no acute distress.  Dermatology: Skin is warm, dry and supple bilateral lower extremities. Interspaces are clear of maceration and debris.    Vascular: Palpable pedal pulses bilaterally. Capillary refill within normal limits.  No appreciable edema.  No erythema or calor.  Neurological: Light touch sensation grossly intact bilateral feet.   Musculoskeletal Exam:  pain on palpation left 4th / 5th metatarsal - cuboid joint.  No peroneal tendon pain.  No pain of EDB muscle belly.  MMT 5/5 without pain.  No palpable bony prominence in tender area.  2nd toes are angulated at the DIPJ. Second toe is longest toe of foot.  Mild bony prominence on medial 1st met head with no pain on palpation.   Radiographic Exam (left foot, 3 WB views, 09/19/22):  Normal osseous mineralization. Lateral angulation of distal phalanx of 2nd toe at DIPJ.  Mild increase in first IM angle with tibial sesamoid position of 3.   Assessment/Plan of Care: 1. Capsulitis of foot, left   2. Acquired deformity of right toe   3. Acquired deformity of toe, left   4. Bunion of right foot      Discussed clinical & radiographic findings with patient today.  Her bunion is mild to asymptomatic.  No need to proceed with surgical correction at this time.  Avoid narrow-toed shoes which can aggravate the deformity.  Regarding the 2nd toe deformities, this can be surgically corrected as an outpatient procedure, which can be done bilateral if she wishes, under IV sedation with local anesthesia, with 2-3 week recovery (DIPJ arthroplasty B/L 2nd toe).  She'll consider it and call if she'd like to proceed. If she is wanting to proceed with surgery, we'll also need to obtain an xray pre-operatively of the right toes.    Regarding the capsulitis pain near the 4th-5th met - cuboid joints, she can avoid the foot massages by her spouse since when he squeezes the area, that's when it hurts the most.  Powerstep arch supports may offer her better position of the foot when in shoe gear, which could decrease pressure to the lateral column of the foot.  Offered a cortisone injection, but she'll hold off for now.  Recommended Voltaren gel BID to the area prn pain. May take ibuprofen 600mg  BID for pain as well.  Clerance Lav, DPM, FACFAS [facility]     [address]  [place], [place] [address]                Office [phone]  Fax [phone]

## 2022-09-21 ENCOUNTER — Encounter: Payer: Self-pay | Admitting: Podiatry

## 2022-09-21 DIAGNOSIS — M2062 Acquired deformities of toe(s), unspecified, left foot: Secondary | ICD-10-CM | POA: Insufficient documentation

## 2022-09-21 DIAGNOSIS — F909 Attention-deficit hyperactivity disorder, unspecified type: Secondary | ICD-10-CM | POA: Insufficient documentation

## 2022-09-21 DIAGNOSIS — M2061 Acquired deformities of toe(s), unspecified, right foot: Secondary | ICD-10-CM | POA: Insufficient documentation

## 2022-09-21 DIAGNOSIS — M21611 Bunion of right foot: Secondary | ICD-10-CM | POA: Insufficient documentation

## 2022-11-21 ENCOUNTER — Ambulatory Visit: Payer: Commercial Managed Care - HMO | Admitting: Podiatry

## 2022-12-26 ENCOUNTER — Encounter: Payer: Self-pay | Admitting: Podiatry

## 2022-12-26 ENCOUNTER — Ambulatory Visit: Payer: Managed Care, Other (non HMO) | Admitting: Podiatry

## 2022-12-26 ENCOUNTER — Ambulatory Visit (INDEPENDENT_AMBULATORY_CARE_PROVIDER_SITE_OTHER): Payer: Managed Care, Other (non HMO)

## 2022-12-26 DIAGNOSIS — M205X1 Other deformities of toe(s) (acquired), right foot: Secondary | ICD-10-CM

## 2022-12-26 DIAGNOSIS — M2061 Acquired deformities of toe(s), unspecified, right foot: Secondary | ICD-10-CM | POA: Diagnosis not present

## 2022-12-26 DIAGNOSIS — M205X2 Other deformities of toe(s) (acquired), left foot: Secondary | ICD-10-CM | POA: Diagnosis not present

## 2022-12-26 NOTE — Progress Notes (Signed)
   Chief Complaint  Patient presents with   Toe Pain    Bilateral 2nd toe pain with angulation deformity. Here for surgical consultation   HPI: 37 y.o. female presents today to discuss surgical correction of the bilateral second toe deformities.  She is having continued pain to the area and conservative measures have been unsuccessful  History reviewed. No pertinent past medical history.  History reviewed. No pertinent surgical history.  Allergies  Allergen Reactions   Benadryl [Diphenhydramine] Other (See Comments)    Restless leg syndrome     Clindamycin/Lincomycin Hives    Physical Exam: General: The patient is alert and oriented x3 in no acute distress.  Dermatology: Skin is warm, dry and supple bilateral lower extremities. Interspaces are clear of maceration and debris.    Vascular: Palpable pedal pulses bilaterally. Capillary refill within normal limits.  No appreciable edema.  No erythema or calor.  Neurological: Light touch sensation grossly intact bilateral feet.   Musculoskeletal Exam: Semiflexible contracture of the bilateral second toe at the DIPJ with lateral angulation of the distal portion of the toe at the level of the DIPJ.  Pain on palpation of the joint area.  Radiographic Exam (R foot, 3WB views, 12/26/22)  (L foot XR on file from 09/19/22 visit):  Normal osseous mineralization.  Second toes longest toe.  There is lateral angulation of the distal phalanx at the level of the DIPJ.  This can also be seen in the second toe on the right foot films from 09/19/2022.  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 Acquired deformity of right toe    Discussed clinical and radiographic findings with patient today.  Discussed bilateral arthroplasty of the second toe DIPJ in detail with the patient today.  She would like to proceed with bilateral correction of the second toe deformities.  Discussed how this would be an outpatient procedure performed at  a surgical center.  This will be performed under IV sedation with local anesthesia to the toe.  Discussed expected postoperative course and expectations.  Discussed benefits, risks, possible postoperative complications with the patient.  Also discussed possible sequela if she does not proceed with surgical intervention at this time.  Verbal and written consent were obtained preoperatively.  She noted she like to try to have the procedure performed prior to the end of the year.   Clerance Lav, DPM, FACFAS [facility]     [address] [place], [place] [address]                Office [phone]  Fax [phone]

## 2022-12-28 ENCOUNTER — Encounter: Payer: Self-pay | Admitting: Podiatry

## 2022-12-28 DIAGNOSIS — M205X2 Other deformities of toe(s) (acquired), left foot: Secondary | ICD-10-CM | POA: Insufficient documentation

## 2022-12-31 ENCOUNTER — Telehealth: Payer: Self-pay

## 2022-12-31 NOTE — Telephone Encounter (Signed)
Received surgery paperwork from the [facility] office.Left a message for Fareeha to call and schedule surgery with Dr. Burna Mortimer.

## 2023-01-03 ENCOUNTER — Telehealth: Payer: Self-pay | Admitting: Podiatry

## 2023-01-03 NOTE — Telephone Encounter (Signed)
DOS- 01/22/2023  HAMMERTOE REPAIR 2ND [id number]  [employer] EFFECTIVE DATE- 02/12/2022  DEDUCTIBLE- $6500.00 WITH REMAINING $5,603.95  OOP- $9450.00 WITH REMAINING $8,196.57  COINSURANCE- 50%  PER THE [employer] AUTOMATED SYSTEM, PRIOR AUTH IS NOT REQUIRED FOR CPT CODE 47829.  CALL CONFIRMATION #: [id number]

## 2023-01-21 ENCOUNTER — Telehealth: Payer: Self-pay | Admitting: Urology

## 2023-01-21 NOTE — Telephone Encounter (Signed)
Pt called saying she needs to cxl her sx with Dr. Burna Mortimer on 01/22/23 due to the cost that the sx will be, she stated she is going to look at other insurance and she will call back when she is ready to reschedule. I have informed Aram Beecham with [facility] and Dr. Burna Mortimer of this change.

## 2023-01-29 ENCOUNTER — Encounter: Payer: Managed Care, Other (non HMO) | Admitting: Podiatry

## 2023-01-30 ENCOUNTER — Encounter: Payer: Managed Care, Other (non HMO) | Admitting: Podiatry

## 2023-02-04 ENCOUNTER — Encounter: Payer: Managed Care, Other (non HMO) | Admitting: Podiatry

## 2023-02-07 ENCOUNTER — Encounter: Payer: Managed Care, Other (non HMO) | Admitting: Podiatry

## 2023-02-18 ENCOUNTER — Encounter: Payer: Managed Care, Other (non HMO) | Admitting: Podiatry

## 2023-02-20 ENCOUNTER — Encounter: Payer: Managed Care, Other (non HMO) | Admitting: Podiatry

## 2023-03-08 ENCOUNTER — Telehealth: Payer: Self-pay | Admitting: Podiatry

## 2023-03-08 NOTE — Telephone Encounter (Signed)
Pt called stating that she is ready to reschedule her surgery with Dr.Dia . She's looking for a date closer to the beginning of March.

## 2023-03-20 ENCOUNTER — Telehealth: Payer: Self-pay | Admitting: Podiatry

## 2023-03-20 NOTE — Telephone Encounter (Signed)
 DOS-04/16/23  HAMMERTOE REPAIR 2ND [id number]  [employer] EFFECTIVE DATE- 02/13/23  DEDUCTIBLE- $0.00 OOP-$1495.00 WITH SAME REMAINING  COINSURANCE- 30%  SPOKE WITH JELEANERYA T FROM [employer] AND SHE STATED THAT PRIOR AUTH IS NOT REQUIRED FOR CPT CODE 71714.  CALL REF #: [id number]

## 2023-04-16 ENCOUNTER — Other Ambulatory Visit: Payer: Self-pay | Admitting: Podiatry

## 2023-04-16 DIAGNOSIS — M2042 Other hammer toe(s) (acquired), left foot: Secondary | ICD-10-CM | POA: Diagnosis not present

## 2023-04-16 DIAGNOSIS — M2041 Other hammer toe(s) (acquired), right foot: Secondary | ICD-10-CM | POA: Diagnosis not present

## 2023-04-16 HISTORY — PX: OTHER SURGICAL HISTORY: SHX169

## 2023-04-16 MED ORDER — HYDROCODONE-ACETAMINOPHEN 5-325 MG PO TABS: ORAL_TABLET | ORAL | 0 refills | Status: DC

## 2023-04-16 MED ORDER — AMOXICILLIN 500 MG PO CAPS: 500.0000 mg | ORAL_CAPSULE | Freq: Two times a day (BID) | ORAL | 0 refills | Status: AC

## 2023-04-16 NOTE — Progress Notes (Signed)
 Post stop medications were sent in for the patient for her surgery this afternoon.

## 2023-04-23 DIAGNOSIS — R87613 High grade squamous intraepithelial lesion on cytologic smear of cervix (HGSIL): Secondary | ICD-10-CM | POA: Diagnosis not present

## 2023-04-23 DIAGNOSIS — N912 Amenorrhea, unspecified: Secondary | ICD-10-CM | POA: Diagnosis not present

## 2023-04-23 DIAGNOSIS — R87612 Low grade squamous intraepithelial lesion on cytologic smear of cervix (LGSIL): Secondary | ICD-10-CM | POA: Diagnosis not present

## 2023-04-25 ENCOUNTER — Ambulatory Visit (INDEPENDENT_AMBULATORY_CARE_PROVIDER_SITE_OTHER): Payer: 59 | Admitting: Podiatry

## 2023-04-25 ENCOUNTER — Ambulatory Visit (INDEPENDENT_AMBULATORY_CARE_PROVIDER_SITE_OTHER)

## 2023-04-25 DIAGNOSIS — Z9889 Other specified postprocedural states: Secondary | ICD-10-CM | POA: Diagnosis not present

## 2023-04-28 ENCOUNTER — Encounter: Payer: Self-pay | Admitting: Podiatry

## 2023-04-28 NOTE — Progress Notes (Signed)
       Subjective:  Patient ID: Ashley Browning, female    DOB: 07/22/1985,  MRN: [id number]  Ashley Browning presents to clinic today for:  Chief Complaint  Patient presents with   POV    POV 1 Bilateral hammertoes correction. Incision looks good and clean, toes are a little swollen with a light pink color.  Not diabetic and no anti coag.    Patient is 1 week postop after undergoing bilateral second toe DIPJ arthroplasty.  Patient denies fever, chills, night sweats, nausea/vomiting.  Patient denies chest pain or shortness of breath.  Patient denies calf pain.  Her dressing is clean dry and intact.  States that she has been elevating and icing the feet.  She is wearing her postoperative shoes today.  History reviewed. No pertinent past medical history.  Past Surgical History:  Procedure Laterality Date   Arthroplasty of the toe Bilateral 04/16/2023   Second toe DIPJ bilateral    Allergies  Allergen Reactions   Benadryl [Diphenhydramine] Other (See Comments)    Restless leg syndrome     Clindamycin/Lincomycin Hives    Objective:  Physical Examination: There are palpable pedal pulses.  There is localized edema to the surgical areas.  The incisions are well-approximated and the sutures are holding well.  No active drainage is noted.  No clinical signs of infection are seen.  The toes are in rectus position  Radiographic Examination (3 views, bilateral foot, 04/25/2023): Normal osseous mineralization.  The second toes are in good, corrected position.  No evidence of osteolysis or periosteal reaction are noted.  Assessment/Plan: 1. Post-operative state    Informed the patient that the toes are looking good.  Continue with elevation above the level of the heart and use of ice packs daily.  Continue with the surgical shoe at all times weightbearing.  As long as she does not have heavy blankets or sheets she can try to sleep without the blankets on the toes but if there is any pressure  causing discomfort to the second toes she needs to put the postoperative shoe back on and wear this when sleeping.  The toes were redressed with Xeroform gauze and a dry sterile dressing was applied followed by Ace wrap application.  Will follow-up in 1 week.  If swelling continues to improve will plan for suture removal at next visit.  Clerance Lav, DPM, FACFAS [facility]     [address] [place], [place] [address]                Office [phone]  Fax [phone]

## 2023-05-09 ENCOUNTER — Encounter: Payer: 59 | Admitting: Podiatry

## 2023-05-09 ENCOUNTER — Ambulatory Visit (INDEPENDENT_AMBULATORY_CARE_PROVIDER_SITE_OTHER): Admitting: Podiatry

## 2023-05-09 DIAGNOSIS — Z9889 Other specified postprocedural states: Secondary | ICD-10-CM

## 2023-05-09 NOTE — Progress Notes (Signed)
       Subjective:  Patient ID: Ashley Browning, female    DOB: 12-03-1985,  MRN: [id number]  Ashley Browning presents to clinic today for:  Chief Complaint  Patient presents with   POV    POV 2, suture removal . She was not wearing the post op shoes today and had them wrapped with ace wrap, no gauze. Still having 3/10 pain at times when on her feet. While off her feet they do not hurt. Not diabetic , no anti coag.   Patient is 2 weeks postop after undergoing a bilateral second toe DIPJ arthroplasty.  Patient denies fever, chills, night sweats, nausea/vomiting.  Patient denies chest pain or shortness of breath.  Patient denies calf pain.  She presents with her surgical shoes today.  History reviewed. No pertinent past medical history.  Past Surgical History:  Procedure Laterality Date   Arthroplasty of the toe Bilateral 04/16/2023   Second toe DIPJ bilateral    Allergies  Allergen Reactions   Benadryl [Diphenhydramine] Other (See Comments)    Restless leg syndrome     Clindamycin/Lincomycin Hives    Objective:  Physical Examination: There are palpable pedal pulses.  There is localized edema to the surgical areas.  The incision is well-approximated and the sutures are holding well.  No active drainage is noted.  No clinical signs of infection are seen.  The toes are in corrected position  Assessment/Plan: 1. Post-operative state    The sutures were removed uneventfully today.  These were replaced with Steri-Strips as well as antibiotic ointment and a dry sterile dressing.  Patient informed that the incision is not 100% coapted at this time and to avoid excessive movement to the DIPJ of the second toe.  In 1 week she may attempt to return to regular shoe gear.  However if the shoe feels tight due to residual swelling in the toes she can remain in the surgical shoe.  She can begin resuming some more weightbearing activities at this time.  She can remove the dressing within the next few  days and begin showering.  She will need to rewrap the area with antibiotic ointment and a light gauze dressing daily.   Return in about 2 weeks (around 05/23/2023) for post-op recheck.   Clerance Lav, DPM, FACFAS [facility]     [address] [place], [place] [address]                Office [phone]  Fax [phone]

## 2023-05-12 ENCOUNTER — Encounter: Payer: Self-pay | Admitting: Podiatry

## 2023-05-23 ENCOUNTER — Ambulatory Visit (INDEPENDENT_AMBULATORY_CARE_PROVIDER_SITE_OTHER)

## 2023-05-23 ENCOUNTER — Ambulatory Visit (INDEPENDENT_AMBULATORY_CARE_PROVIDER_SITE_OTHER): Payer: 59 | Admitting: Podiatry

## 2023-05-23 DIAGNOSIS — M205X2 Other deformities of toe(s) (acquired), left foot: Secondary | ICD-10-CM

## 2023-05-23 DIAGNOSIS — Z9889 Other specified postprocedural states: Secondary | ICD-10-CM

## 2023-05-23 DIAGNOSIS — M205X1 Other deformities of toe(s) (acquired), right foot: Secondary | ICD-10-CM

## 2023-05-23 NOTE — Progress Notes (Signed)
    Subjective:  Patient ID: Ashley Browning, female    DOB: October 19, 1985,  MRN: [id number]  Ashley Browning presents to clinic today for:  Chief Complaint  Patient presents with   POV    POV 3, xrays up. There is some redness and brusing present. On the left 2nd there looks like a stitch hanging, I did not pull it. She is getting swelling in the daytime. Not diabetic, no anti coag.    Patient is 5 weeks postop after undergoing a bilateral 2nd toe DIPJ arthroplasty .  Patient denies fever, chills, night sweats, nausea/vomiting.  Patient denies chest pain or shortness of breath.  Patient denies calf pain.  Patient still having issues with swelling in the toes and is concerned about the incision area.  History reviewed. No pertinent past medical history.  Past Surgical History:  Procedure Laterality Date   Arthroplasty of the toe Bilateral 04/16/2023   Second toe DIPJ bilateral    Allergies  Allergen Reactions   Benadryl [Diphenhydramine] Other (See Comments)    Restless leg syndrome     Clindamycin/Lincomycin Hives    Objective:  Physical Examination: There are palpable pedal pulses.  There is localized edema to the surgical areas.  The incisions are well-approximated with some induration along the incision area bilateral.  No active drainage is noted.  No clinical signs of infection are seen.  There are 2 small Vicryl sutures "spitting out" from the surgical area on the left second toe DIPJ  Radiographic Examination (3 views, Bilateral foot, WB, 05/23/23): No fracture seen.  No periosteal reaction noted near the surgical areas.  No gas within the soft tissues.  Assessment/Plan: 1. Post-operative state   2. Acquired mallet toe of both feet     Patient informed that the incision can often become indurated especially with the DIPJ arthroplasties.  This will improve over time.  Patient encouraged to wrap the toe with Coban from distal to proximal to decrease edema in the toes and make  the toes more comfortable during the day.  If the swelling is uncontrolled they can continue to fill in at the DIPJ area and cause increased space between the middle and distal phalanges.  The 2 suture threads in the left second toe were cut away uneventfully.  Patient may use silicone scar sheets over the incisions at bedtime and then wrap with Coban during the day.  No aggressive massage for now.  Will reassess in approximately 2 to 4 weeks and then may recommend aggressive massage to the toe at that time to improve lymphatic flow.  Return in about 4 weeks (around 06/20/2023) for post-op recheck.   Joe Murders, DPM, FACFAS [facility]     [address] [place], [place] [address]                Office [phone]  Fax [phone]

## 2023-05-26 ENCOUNTER — Encounter: Payer: Self-pay | Admitting: Podiatry

## 2023-06-13 DIAGNOSIS — N979 Female infertility, unspecified: Secondary | ICD-10-CM | POA: Diagnosis not present

## 2023-06-19 ENCOUNTER — Encounter: Payer: Self-pay | Admitting: Podiatry

## 2023-06-19 ENCOUNTER — Ambulatory Visit (INDEPENDENT_AMBULATORY_CARE_PROVIDER_SITE_OTHER): Admitting: Podiatry

## 2023-06-19 DIAGNOSIS — Z01419 Encounter for gynecological examination (general) (routine) without abnormal findings: Secondary | ICD-10-CM | POA: Diagnosis not present

## 2023-06-19 DIAGNOSIS — N926 Irregular menstruation, unspecified: Secondary | ICD-10-CM | POA: Diagnosis not present

## 2023-06-19 DIAGNOSIS — Z9889 Other specified postprocedural states: Secondary | ICD-10-CM

## 2023-06-19 DIAGNOSIS — Z6828 Body mass index (BMI) 28.0-28.9, adult: Secondary | ICD-10-CM | POA: Diagnosis not present

## 2023-06-19 NOTE — Progress Notes (Signed)
 Patient was scheduled for a postop visit today for bilateral second toe DIPJ arthroplasty.  She was not able to remain for her appointment due to a pending appointment on her and elsewhere.  We were running behind this afternoon and apologize for the delay.  I reached out to the patient to see if she would at least be able to send me photos of the toes so we can advise from there and once again apologize for the delay and understood that she needed to leave.

## 2023-07-03 DIAGNOSIS — N912 Amenorrhea, unspecified: Secondary | ICD-10-CM | POA: Diagnosis not present

## 2023-07-25 DIAGNOSIS — N911 Secondary amenorrhea: Secondary | ICD-10-CM | POA: Diagnosis not present

## 2023-07-31 DIAGNOSIS — Z3481 Encounter for supervision of other normal pregnancy, first trimester: Secondary | ICD-10-CM | POA: Diagnosis not present

## 2023-07-31 DIAGNOSIS — Z3685 Encounter for antenatal screening for Streptococcus B: Secondary | ICD-10-CM | POA: Diagnosis not present

## 2023-07-31 LAB — HEPATITIS C ANTIBODY: HCV Ab: NEGATIVE

## 2023-07-31 LAB — OB RESULTS CONSOLE HIV ANTIBODY (ROUTINE TESTING): HIV: NONREACTIVE

## 2023-07-31 LAB — OB RESULTS CONSOLE ANTIBODY SCREEN: Antibody Screen: NEGATIVE

## 2023-07-31 LAB — OB RESULTS CONSOLE HEPATITIS B SURFACE ANTIGEN: Hepatitis B Surface Ag: NEGATIVE

## 2023-07-31 LAB — OB RESULTS CONSOLE RUBELLA ANTIBODY, IGM: Rubella: IMMUNE

## 2023-07-31 LAB — OB RESULTS CONSOLE RPR: RPR: NONREACTIVE

## 2023-08-13 DIAGNOSIS — Z3481 Encounter for supervision of other normal pregnancy, first trimester: Secondary | ICD-10-CM | POA: Diagnosis not present

## 2023-08-13 LAB — OB RESULTS CONSOLE GC/CHLAMYDIA
Chlamydia: NEGATIVE
Neisseria Gonorrhea: NEGATIVE

## 2023-09-17 DIAGNOSIS — Z361 Encounter for antenatal screening for raised alphafetoprotein level: Secondary | ICD-10-CM | POA: Diagnosis not present

## 2023-10-18 DIAGNOSIS — Z363 Encounter for antenatal screening for malformations: Secondary | ICD-10-CM | POA: Diagnosis not present

## 2023-10-18 DIAGNOSIS — Z3400 Encounter for supervision of normal first pregnancy, unspecified trimester: Secondary | ICD-10-CM | POA: Diagnosis not present

## 2023-10-18 DIAGNOSIS — Z3A20 20 weeks gestation of pregnancy: Secondary | ICD-10-CM | POA: Diagnosis not present

## 2023-11-14 DIAGNOSIS — Z3A23 23 weeks gestation of pregnancy: Secondary | ICD-10-CM | POA: Diagnosis not present

## 2023-11-14 DIAGNOSIS — Z362 Encounter for other antenatal screening follow-up: Secondary | ICD-10-CM | POA: Diagnosis not present

## 2023-11-14 DIAGNOSIS — Z3482 Encounter for supervision of other normal pregnancy, second trimester: Secondary | ICD-10-CM | POA: Diagnosis not present

## 2023-12-10 DIAGNOSIS — Z3480 Encounter for supervision of other normal pregnancy, unspecified trimester: Secondary | ICD-10-CM | POA: Diagnosis not present

## 2023-12-10 LAB — OB RESULTS CONSOLE ABO/RH: "RH Type ": NEGATIVE

## 2023-12-24 DIAGNOSIS — Z23 Encounter for immunization: Secondary | ICD-10-CM | POA: Diagnosis not present

## 2023-12-24 DIAGNOSIS — Z3403 Encounter for supervision of normal first pregnancy, third trimester: Secondary | ICD-10-CM | POA: Diagnosis not present

## 2023-12-24 DIAGNOSIS — Z3A29 29 weeks gestation of pregnancy: Secondary | ICD-10-CM | POA: Diagnosis not present

## 2023-12-24 DIAGNOSIS — O360130 Maternal care for anti-D [Rh] antibodies, third trimester, not applicable or unspecified: Secondary | ICD-10-CM | POA: Diagnosis not present

## 2023-12-24 DIAGNOSIS — Z6791 Unspecified blood type, Rh negative: Secondary | ICD-10-CM | POA: Diagnosis not present

## 2024-01-07 DIAGNOSIS — Z3400 Encounter for supervision of normal first pregnancy, unspecified trimester: Secondary | ICD-10-CM | POA: Diagnosis not present

## 2024-01-07 DIAGNOSIS — Z3A31 31 weeks gestation of pregnancy: Secondary | ICD-10-CM | POA: Diagnosis not present

## 2024-01-07 DIAGNOSIS — O35EXX0 Maternal care for other (suspected) fetal abnormality and damage, fetal genitourinary anomalies, not applicable or unspecified: Secondary | ICD-10-CM | POA: Diagnosis not present

## 2024-02-04 DIAGNOSIS — N760 Acute vaginitis: Secondary | ICD-10-CM | POA: Diagnosis not present

## 2024-02-04 DIAGNOSIS — Z3403 Encounter for supervision of normal first pregnancy, third trimester: Secondary | ICD-10-CM | POA: Diagnosis not present

## 2024-02-04 DIAGNOSIS — Z3A35 35 weeks gestation of pregnancy: Secondary | ICD-10-CM | POA: Diagnosis not present

## 2024-02-13 NOTE — L&D Delivery Note (Signed)
 Vtx at +3 station and roa.  Pt desires VE.  I discussed the R&B of VE including but not limited to injury to fetus but the potential benefit of expedited delivery.  She gives her informed consent and wishes to proceed. On the 3rd pull delivered viable female apgars 8,9 over 1st degree ML lac   Placenta delivered spontaneously intact with 3VC. Repair with 3-0 Chromic with good support and hemostasis noted and R/V exam confirms.  Small mons pubis skig tag removed per pt request Mother and baby were doing well.  EBL 200  Alm Cook, MD

## 2024-02-14 LAB — OB RESULTS CONSOLE GBS: GBS: NEGATIVE

## 2024-02-24 ENCOUNTER — Telehealth (HOSPITAL_COMMUNITY): Payer: Self-pay | Admitting: *Deleted

## 2024-02-24 ENCOUNTER — Encounter (HOSPITAL_COMMUNITY): Payer: Self-pay | Admitting: *Deleted

## 2024-02-24 NOTE — Telephone Encounter (Signed)
 Preadmission screen

## 2024-02-26 ENCOUNTER — Inpatient Hospital Stay (HOSPITAL_COMMUNITY)
Admission: AD | Admit: 2024-02-26 | Discharge: 2024-02-26 | Disposition: A | Discharge status: Home / Self Care | Attending: Obstetrics and Gynecology | Admitting: Obstetrics and Gynecology

## 2024-02-26 ENCOUNTER — Encounter (HOSPITAL_COMMUNITY): Payer: Self-pay | Admitting: Obstetrics and Gynecology

## 2024-02-26 DIAGNOSIS — Z3A38 38 weeks gestation of pregnancy: Secondary | ICD-10-CM

## 2024-02-26 DIAGNOSIS — O471 False labor at or after 37 completed weeks of gestation: Secondary | ICD-10-CM

## 2024-02-26 HISTORY — DX: Other specified health status: Z78.9

## 2024-02-26 NOTE — MAU Provider Note (Signed)
 Ms. Ashley Browning is a G1P0 at [redacted]w[redacted]d seen in MAU for labor. RN labor check, not seen by provider.   SVE by RN Dilation: Closed Exam by:: Oleh Loges RN   NST - FHR: 140 bpm / moderate variability / accels present / decels absent / TOCO: regular every 3-5 mins   Plan:  D/C home with labor precautions 2/3-1-1 Rule instructions given: Return to MAU for painful contractions every 2-3 minutes, lasting 1 minute each for 1.5 hours. Keep scheduled appt with P4W on 02/27/2024  Ala Cart, CNM  02/26/2024 11:21 PM

## 2024-02-26 NOTE — MAU Note (Addendum)
 Ashley Browning is a 39 y.o. at [redacted]w[redacted]d here in MAU reporting:   Pt states around 6pm today ctxs were consistent and strong for about an hour. Ctxs now are about 7-63min apart. Pt states she feels relief when she's sitting down.  Pt also reports being nauseas for the past few days.   Denies any LOF or bleeding.  +fm. Ashley Browning.  Gbs neg.  [facility].   Pain score: lower back and lower abdomen ctxs 6/10 pain  FHT: 155 Lab orders placed from triage: labor eval.    Bp: 117/70 100 pulse rate Resp 18.

## 2024-02-26 NOTE — Discharge Instructions (Signed)
 2/3-1-1 Rule Go to MAU for painful contractions every 2-3 minutes, lasting 1 minute each for 1.5 hours.

## 2024-03-02 ENCOUNTER — Encounter (HOSPITAL_COMMUNITY): Payer: Self-pay | Admitting: *Deleted

## 2024-03-03 NOTE — H&P (Signed)
 Ashley Browning is a 39 y.o. female presenting for induction of labor due to geriatric pregnancy at 39 weeks.  Normal pregnancy with GBS negative. OB History     Gravida  1   Para      Term      Preterm      AB      Living         SAB      IAB      Ectopic      Multiple      Live Births             Past Medical History:  Diagnosis Date   Medical history non-contributory    Past Surgical History:  Procedure Laterality Date   Arthroplasty of the toe Bilateral 04/16/2023   Second toe DIPJ bilateral   UMBILICAL HERNIA REPAIR     Family History: family history includes Diabetes in her maternal grandmother. Social History:  reports that she has never smoked. She has never used smokeless tobacco. She reports that she does not drink alcohol and does not use drugs.     Maternal Diabetes: No Genetic Screening: Normal Maternal Ultrasounds/Referrals: Normal Fetal Ultrasounds or other Referrals:  None Maternal Substance Abuse:  No Significant Maternal Medications:  None Significant Maternal Lab Results:  Group B Strep negative Number of Prenatal Visits:greater than 3 verified prenatal visits Maternal Vaccinations:TDap declined Other Comments:  None  Review of Systems History   There were no vitals taken for this visit. Exam Physical Exam  Vitals and nursing note reviewed. Exam conducted with a chaperone present.  Constitutional:      Appearance: Normal appearance.  HENT:     Head: Normocephalic.  Eyes:     Pupils: Pupils are equal, round, and reactive to light.  Cardiovascular:     Rate and Rhythm: Normal rate and regular rhythm.     Pulses: Normal pulses.  Abdominal:     General: Abdomen is Gravid, nontender Neurological:     Mental Status: She is alert. Pt wishes Full resuscitation in the event of a code. Prenatal labs: ABO, Rh: B/Negative/-- (10/28 0000) Antibody: Negative (06/18 0000) Rubella: Immune (06/18 0000) RPR: Nonreactive (06/18 0000)   HBsAg: Negative (06/18 0000)  HIV: Non-reactive (06/18 0000)  GBS: Negative/-- (01/02 0000)   Assessment/Plan: IUP at 70 weeks 39 yo geriatric pregnancy for medical IOL Plan cytotec  cx ripening, then AROM/Pit when able Anticipate SVD   Alm JAYSON Cook 03/03/2024, 5:47 PM

## 2024-03-04 ENCOUNTER — Inpatient Hospital Stay (HOSPITAL_COMMUNITY): Admitting: Anesthesiology

## 2024-03-04 ENCOUNTER — Inpatient Hospital Stay (HOSPITAL_COMMUNITY)

## 2024-03-04 ENCOUNTER — Encounter (HOSPITAL_COMMUNITY): Payer: Self-pay | Admitting: Obstetrics and Gynecology

## 2024-03-04 ENCOUNTER — Other Ambulatory Visit: Payer: Self-pay

## 2024-03-04 ENCOUNTER — Inpatient Hospital Stay (HOSPITAL_COMMUNITY)
Admission: RE | Admit: 2024-03-04 | Discharge: 2024-03-06 | DRG: 806 | Disposition: A | Discharge status: Home / Self Care | Payer: Self-pay | Attending: Obstetrics and Gynecology | Admitting: Obstetrics and Gynecology

## 2024-03-04 DIAGNOSIS — D62 Acute posthemorrhagic anemia: Secondary | ICD-10-CM | POA: Diagnosis not present

## 2024-03-04 DIAGNOSIS — O9081 Anemia of the puerperium: Secondary | ICD-10-CM | POA: Diagnosis not present

## 2024-03-04 DIAGNOSIS — L918 Other hypertrophic disorders of the skin: Secondary | ICD-10-CM | POA: Diagnosis present

## 2024-03-04 DIAGNOSIS — Z833 Family history of diabetes mellitus: Secondary | ICD-10-CM | POA: Diagnosis not present

## 2024-03-04 DIAGNOSIS — Z3A39 39 weeks gestation of pregnancy: Secondary | ICD-10-CM | POA: Diagnosis not present

## 2024-03-04 DIAGNOSIS — O9962 Diseases of the digestive system complicating childbirth: Secondary | ICD-10-CM | POA: Diagnosis present

## 2024-03-04 DIAGNOSIS — O26893 Other specified pregnancy related conditions, third trimester: Principal | ICD-10-CM | POA: Diagnosis present

## 2024-03-04 DIAGNOSIS — K219 Gastro-esophageal reflux disease without esophagitis: Secondary | ICD-10-CM | POA: Diagnosis present

## 2024-03-04 DIAGNOSIS — Z3490 Encounter for supervision of normal pregnancy, unspecified, unspecified trimester: Principal | ICD-10-CM | POA: Diagnosis present

## 2024-03-04 LAB — CBC
HCT: 34.7 % — ABNORMAL LOW (ref 36.0–46.0)
Hemoglobin: 11.6 g/dL — ABNORMAL LOW (ref 12.0–15.0)
MCH: 27.4 pg (ref 26.0–34.0)
MCHC: 33.4 g/dL (ref 30.0–36.0)
MCV: 81.8 fL (ref 80.0–100.0)
Platelets: 301 K/uL (ref 150–400)
RBC: 4.24 MIL/uL (ref 3.87–5.11)
RDW: 13 % (ref 11.5–15.5)
WBC: 12.5 K/uL — ABNORMAL HIGH (ref 4.0–10.5)
nRBC: 0 % (ref 0.0–0.2)

## 2024-03-04 LAB — TYPE AND SCREEN
ABO/RH(D): B NEG
Antibody Screen: POSITIVE

## 2024-03-04 LAB — HIV ANTIBODY (ROUTINE TESTING W REFLEX): HIV Screen 4th Generation wRfx: NONREACTIVE

## 2024-03-04 LAB — SYPHILIS: RPR W/REFLEX TO RPR TITER AND TREPONEMAL ANTIBODIES, TRADITIONAL SCREENING AND DIAGNOSIS ALGORITHM: RPR Ser Ql: NONREACTIVE

## 2024-03-04 MED ORDER — COCONUT OIL OIL
1.0000 | TOPICAL_OIL | Status: DC | PRN
  Administered 2024-03-05: 1 via TOPICAL

## 2024-03-04 MED ORDER — LIDOCAINE HCL (PF) 1 % IJ SOLN: 30.0000 mL | INTRAMUSCULAR | Status: DC | PRN

## 2024-03-04 MED ORDER — ZOLPIDEM TARTRATE 5 MG PO TABS: 5.0000 mg | ORAL_TABLET | Freq: Every evening | ORAL | Status: DC | PRN

## 2024-03-04 MED ORDER — FENTANYL CITRATE (PF) 100 MCG/2ML IJ SOLN
50.0000 ug | INTRAMUSCULAR | Status: DC | PRN
  Administered 2024-03-04 (×2): 100 ug via INTRAVENOUS
  Administered 2024-03-04: 50 ug via INTRAVENOUS
  Administered 2024-03-04: 100 ug via INTRAVENOUS
  Filled 2024-03-04 (×4): qty 2

## 2024-03-04 MED ORDER — DIPHENHYDRAMINE HCL 25 MG PO CAPS: 25.0000 mg | ORAL_CAPSULE | Freq: Four times a day (QID) | ORAL | Status: DC | PRN

## 2024-03-04 MED ORDER — OXYTOCIN-SODIUM CHLORIDE 30-0.9 UT/500ML-% IV SOLN
1.0000 m[IU]/min | INTRAVENOUS | Status: DC
  Administered 2024-03-04: 2 m[IU]/min via INTRAVENOUS

## 2024-03-04 MED ORDER — ONDANSETRON HCL 4 MG/2ML IJ SOLN: 4.0000 mg | INTRAMUSCULAR | Status: DC | PRN

## 2024-03-04 MED ORDER — OXYCODONE-ACETAMINOPHEN 5-325 MG PO TABS
1.0000 | ORAL_TABLET | ORAL | Status: DC | PRN
  Administered 2024-03-05: 1 via ORAL
  Filled 2024-03-04: qty 1

## 2024-03-04 MED ORDER — BENZOCAINE-MENTHOL 20-0.5 % EX AERO
1.0000 | INHALATION_SPRAY | CUTANEOUS | Status: DC | PRN
  Filled 2024-03-04: qty 56

## 2024-03-04 MED ORDER — MEASLES, MUMPS & RUBELLA VAC ~~LOC~~ SUSR: 0.5000 mL | Freq: Once | SUBCUTANEOUS | Status: DC

## 2024-03-04 MED ORDER — OXYCODONE-ACETAMINOPHEN 5-325 MG PO TABS: 1.0000 | ORAL_TABLET | ORAL | Status: DC | PRN

## 2024-03-04 MED ORDER — ACETAMINOPHEN 325 MG PO TABS: 650.0000 mg | ORAL_TABLET | ORAL | Status: DC | PRN

## 2024-03-04 MED ORDER — EPHEDRINE 5 MG/ML INJ: 10.0000 mg | INTRAVENOUS | Status: DC | PRN

## 2024-03-04 MED ORDER — LACTATED RINGERS IV SOLN: 500.0000 mL | Freq: Once | INTRAVENOUS | Status: DC

## 2024-03-04 MED ORDER — PHENYLEPHRINE 80 MCG/ML (10ML) SYRINGE FOR IV PUSH (FOR BLOOD PRESSURE SUPPORT): 80.0000 ug | PREFILLED_SYRINGE | INTRAVENOUS | Status: DC | PRN

## 2024-03-04 MED ORDER — OXYCODONE-ACETAMINOPHEN 5-325 MG PO TABS: 2.0000 | ORAL_TABLET | ORAL | Status: DC | PRN

## 2024-03-04 MED ORDER — MISOPROSTOL 25 MCG QUARTER TABLET
25.0000 ug | ORAL_TABLET | Freq: Once | ORAL | Status: AC
  Administered 2024-03-04: 25 ug via VAGINAL
  Filled 2024-03-04: qty 1

## 2024-03-04 MED ORDER — TETANUS-DIPHTH-ACELL PERTUSSIS 5-2-15.5 LF-MCG/0.5 IM SUSP: 0.5000 mL | Freq: Once | INTRAMUSCULAR | Status: DC

## 2024-03-04 MED ORDER — LACTATED RINGERS IV SOLN: 500.0000 mL | INTRAVENOUS | Status: DC | PRN

## 2024-03-04 MED ORDER — SIMETHICONE 80 MG PO CHEW: 80.0000 mg | CHEWABLE_TABLET | ORAL | Status: DC | PRN

## 2024-03-04 MED ORDER — OXYTOCIN-SODIUM CHLORIDE 30-0.9 UT/500ML-% IV SOLN
2.5000 [IU]/h | INTRAVENOUS | Status: DC
  Filled 2024-03-04: qty 500

## 2024-03-04 MED ORDER — WITCH HAZEL-GLYCERIN EX PADS: 1.0000 | MEDICATED_PAD | CUTANEOUS | Status: DC | PRN

## 2024-03-04 MED ORDER — DIBUCAINE (PERIANAL) 1 % EX OINT
1.0000 | TOPICAL_OINTMENT | CUTANEOUS | Status: DC | PRN
  Filled 2024-03-04: qty 28

## 2024-03-04 MED ORDER — PRENATAL MULTIVITAMIN CH
1.0000 | ORAL_TABLET | Freq: Every day | ORAL | Status: DC
  Administered 2024-03-05: 1 via ORAL
  Filled 2024-03-04: qty 1

## 2024-03-04 MED ORDER — TERBUTALINE SULFATE 1 MG/ML IJ SOLN: 0.2500 mg | Freq: Once | INTRAMUSCULAR | Status: DC | PRN

## 2024-03-04 MED ORDER — FAMOTIDINE IN NACL 20-0.9 MG/50ML-% IV SOLN
20.0000 mg | Freq: Once | INTRAVENOUS | Status: AC
  Administered 2024-03-04: 20 mg via INTRAVENOUS
  Filled 2024-03-04: qty 50

## 2024-03-04 MED ORDER — DIPHENHYDRAMINE HCL 50 MG/ML IJ SOLN: 12.5000 mg | INTRAMUSCULAR | Status: DC | PRN

## 2024-03-04 MED ORDER — LACTATED RINGERS IV SOLN: INTRAVENOUS | Status: DC

## 2024-03-04 MED ORDER — FENTANYL-BUPIVACAINE-NACL 0.5-0.125-0.9 MG/250ML-% EP SOLN
12.0000 mL/h | EPIDURAL | Status: DC | PRN
  Administered 2024-03-04: 12 mL/h via EPIDURAL
  Filled 2024-03-04: qty 250

## 2024-03-04 MED ORDER — LIDOCAINE HCL (PF) 1 % IJ SOLN
INTRAMUSCULAR | Status: DC | PRN
  Administered 2024-03-04 (×2): 4 mL via EPIDURAL

## 2024-03-04 MED ORDER — MEDROXYPROGESTERONE ACETATE 150 MG/ML IM SUSP: 150.0000 mg | INTRAMUSCULAR | Status: DC | PRN

## 2024-03-04 MED ORDER — SOD CITRATE-CITRIC ACID 500-334 MG/5ML PO SOLN
30.0000 mL | ORAL | Status: DC | PRN
  Administered 2024-03-04: 30 mL via ORAL
  Filled 2024-03-04: qty 30

## 2024-03-04 MED ORDER — SENNOSIDES-DOCUSATE SODIUM 8.6-50 MG PO TABS
2.0000 | ORAL_TABLET | Freq: Every day | ORAL | Status: DC
  Administered 2024-03-05 – 2024-03-06 (×2): 2 via ORAL
  Filled 2024-03-04 (×2): qty 2

## 2024-03-04 MED ORDER — MISOPROSTOL 50MCG HALF TABLET
50.0000 ug | ORAL_TABLET | ORAL | Status: DC
  Administered 2024-03-04: 50 ug via ORAL
  Filled 2024-03-04: qty 1

## 2024-03-04 MED ORDER — ONDANSETRON HCL 4 MG/2ML IJ SOLN
4.0000 mg | Freq: Four times a day (QID) | INTRAMUSCULAR | Status: DC | PRN
  Administered 2024-03-04: 4 mg via INTRAVENOUS
  Filled 2024-03-04: qty 2

## 2024-03-04 MED ORDER — IBUPROFEN 600 MG PO TABS
600.0000 mg | ORAL_TABLET | Freq: Four times a day (QID) | ORAL | Status: DC
  Administered 2024-03-04 – 2024-03-06 (×7): 600 mg via ORAL
  Filled 2024-03-04 (×6): qty 1

## 2024-03-04 MED ORDER — ONDANSETRON HCL 4 MG PO TABS: 4.0000 mg | ORAL_TABLET | ORAL | Status: DC | PRN

## 2024-03-04 MED ORDER — ACETAMINOPHEN 325 MG PO TABS
650.0000 mg | ORAL_TABLET | ORAL | Status: DC | PRN
  Administered 2024-03-04 – 2024-03-05 (×2): 650 mg via ORAL
  Filled 2024-03-04 (×2): qty 2

## 2024-03-04 MED ORDER — LACTATED RINGERS AMNIOINFUSION
INTRAVENOUS | Status: DC
  Administered 2024-03-04: 300 mL via INTRAUTERINE

## 2024-03-04 MED ORDER — OXYTOCIN BOLUS FROM INFUSION: 333.0000 mL | Freq: Once | INTRAVENOUS | Status: DC

## 2024-03-04 NOTE — Anesthesia Procedure Notes (Signed)
 Epidural Patient location during procedure: OB Start time: 03/04/2024 9:01 AM End time: 03/04/2024 9:06 AM  Staffing Anesthesiologist: Peggye Delon Brunswick, MD Performed: anesthesiologist   Preanesthetic Checklist Completed: patient identified, IV checked, risks and benefits discussed, monitors and equipment checked, pre-op evaluation and timeout performed  Epidural Patient position: sitting Prep: DuraPrep and site prepped and draped Patient monitoring: continuous pulse ox and blood pressure Approach: midline Location: L3-L4 Injection technique: LOR saline  Needle:  Needle type: Tuohy  Needle gauge: 17 G Needle length: 9 cm and 9 Needle insertion depth: 6 cm Catheter type: closed end flexible Catheter size: 19 Gauge Catheter at skin depth: 10 cm Test dose: negative  Assessment Events: blood not aspirated, no cerebrospinal fluid, injection not painful, no injection resistance, no paresthesia and negative IV test  Additional Notes The patient has requested an epidural for labor pain management. Risks and benefits including, but not limited to, infection, bleeding, local anesthetic toxicity, headache, hypotension, back pain, block failure, etc. were discussed with the patient. The patient expressed understanding and consented to the procedure. I confirmed that the patient has no bleeding disorders and is not taking blood thinners. I confirmed the patient's last platelet count with the nurse. A time-out was performed immediately prior to the procedure. Please see nursing documentation for vital signs. Sterile technique was used throughout the whole procedure. Once LOR achieved, the epidural catheter threaded easily without resistance. Aspiration of the catheter was negative for blood and CSF. The epidural was dosed slowly and an infusion was started.  1 attempt(s)Reason for block:procedure for pain

## 2024-03-04 NOTE — Lactation Note (Signed)
 This note was copied from a baby's chart. Lactation Consultation Note  Patient Name: Ashley Browning Unijb'd Date: 03/04/2024 Age:41 hours Reason for consult: Initial assessment;Mother's request;Primapara;1st time breastfeeding;Term;Breastfeeding assistance;RN request;MD order  P1- MD requested latching assistance for this mom because of possible tough tissue. LC placed infant on the right breast in the football hold. Infant was sleepy and became irritated when messed with. LC reviewed stroking the nipple from infant's nose to chin to elicit a gaping mouth. After a few attempts, infant was able to latch deep with flanged lips. Infant would suck a few times, then fall asleep and fall off the breast. We tried this for a few more minutes before LC encouraged trying a nipple shield. LC placed a 20 mm shield. Infant was much more receptive to latch and sustaining the latch. Infant nursed for 16 minutes with stimulation. By the end of the feeding, MOB's nipple was much more everted.   LC reviewed the first 24 hr birthday nap, day 2 cluster feeding, feeding infant on cue 8-12x in 24 hrs, not allowing infant to go over 3 hrs without a feeding, CDC milk storage guidelines, LC services handout and engorgement/breast care. LC encouraged MOB to call for further assistance as needed.  Maternal Data Has patient been taught Hand Expression?: Yes Does the patient have breastfeeding experience prior to this delivery?: No  Feeding Mother's Current Feeding Choice: Breast Milk  LATCH Score Latch: Grasps breast easily, tongue down, lips flanged, rhythmical sucking.  Audible Swallowing: A few with stimulation  Type of Nipple: Flat  Comfort (Breast/Nipple): Filling, red/small blisters or bruises, mild/mod discomfort  Hold (Positioning): Full assist, staff holds infant at breast  LATCH Score: 5   Lactation Tools Discussed/Used Tools: Pump;Flanges;Nipple Shields Nipple shield size: 20 Flange Size:  18 Breast pump type: Manual Pump Education: Setup, frequency, and cleaning;Milk Storage Reason for Pumping: nipple shield use Pumping frequency: 15-20 min every 3 hrs  Interventions Interventions: Breast feeding basics reviewed;Assisted with latch;Hand express;Breast compression;Adjust position;Support pillows;Position options;Hand pump;Education;LC Services brochure  Discharge Discharge Education: Engorgement and breast care;Warning signs for feeding baby Pump: Hands Free;Personal;Manual  Consult Status Consult Status: Follow-up Date: 03/05/24 Follow-up type: In-patient    Recardo Hoit BS, IBCLC 03/04/2024, 7:26 PM

## 2024-03-04 NOTE — Anesthesia Preprocedure Evaluation (Signed)
 "                                  Anesthesia Evaluation  Patient identified by MRN, date of birth, ID band Patient awake    Reviewed: Allergy & Precautions, NPO status , Patient's Chart, lab work & pertinent test results  History of Anesthesia Complications Negative for: history of anesthetic complications  Airway Mallampati: III  TM Distance: >3 FB Neck ROM: Full    Dental   Pulmonary neg pulmonary ROS   Pulmonary exam normal breath sounds clear to auscultation       Cardiovascular negative cardio ROS  Rhythm:Regular Rate:Normal     Neuro/Psych  PSYCHIATRIC DISORDERS (ADHD)      negative neurological ROS     GI/Hepatic Neg liver ROS,GERD  Medicated,,  Endo/Other  negative endocrine ROS    Renal/GU negative Renal ROS     Musculoskeletal   Abdominal   Peds  Hematology  (+) Blood dyscrasia, anemia Lab Results      Component                Value               Date                      WBC                      12.5 (H)            03/04/2024                HGB                      11.6 (L)            03/04/2024                HCT                      34.7 (L)            03/04/2024                MCV                      81.8                03/04/2024                PLT                      301                 03/04/2024              Anesthesia Other Findings   Reproductive/Obstetrics (+) Pregnancy                              Anesthesia Physical Anesthesia Plan  ASA: 2  Anesthesia Plan: Epidural   Post-op Pain Management:    Induction:   PONV Risk Score and Plan:   Airway Management Planned: Natural Airway  Additional Equipment:   Intra-op Plan:   Post-operative Plan:   Informed Consent: I have reviewed the patients History and Physical, chart, labs and discussed the procedure  including the risks, benefits and alternatives for the proposed anesthesia with the patient or authorized representative who has  indicated his/her understanding and acceptance.       Plan Discussed with: Anesthesiologist  Anesthesia Plan Comments: (I have discussed risks of neuraxial anesthesia including but not limited to infection, bleeding, nerve injury, back pain, headache, seizures, and failure of block. Patient denies bleeding disorders and is not currently anticoagulated. Labs have been reviewed. Risks and benefits discussed. All patient's questions answered.  )         Anesthesia Quick Evaluation  "

## 2024-03-05 ENCOUNTER — Inpatient Hospital Stay (HOSPITAL_COMMUNITY): Payer: Self-pay

## 2024-03-05 LAB — CBC
HCT: 28.9 % — ABNORMAL LOW (ref 36.0–46.0)
Hemoglobin: 9.6 g/dL — ABNORMAL LOW (ref 12.0–15.0)
MCH: 27.4 pg (ref 26.0–34.0)
MCHC: 33.2 g/dL (ref 30.0–36.0)
MCV: 82.3 fL (ref 80.0–100.0)
Platelets: 236 K/uL (ref 150–400)
RBC: 3.51 MIL/uL — ABNORMAL LOW (ref 3.87–5.11)
RDW: 13.1 % (ref 11.5–15.5)
WBC: 15.3 K/uL — ABNORMAL HIGH (ref 4.0–10.5)
nRBC: 0 % (ref 0.0–0.2)

## 2024-03-05 MED ORDER — PANTOPRAZOLE SODIUM 40 MG PO TBEC
40.0000 mg | DELAYED_RELEASE_TABLET | Freq: Every day | ORAL | Status: DC
  Filled 2024-03-05: qty 1

## 2024-03-05 MED ORDER — CALCIUM CARBONATE ANTACID 500 MG PO CHEW
1.0000 | CHEWABLE_TABLET | Freq: Two times a day (BID) | ORAL | Status: DC | PRN
  Administered 2024-03-05: 200 mg via ORAL

## 2024-03-05 MED ORDER — DOCUSATE SODIUM 100 MG PO CAPS
100.0000 mg | ORAL_CAPSULE | Freq: Every day | ORAL | Status: DC
  Administered 2024-03-05 – 2024-03-06 (×2): 100 mg via ORAL
  Filled 2024-03-05 (×2): qty 1

## 2024-03-05 MED ORDER — RHO D IMMUNE GLOBULIN 1500 UNIT/2ML IJ SOSY
300.0000 ug | PREFILLED_SYRINGE | Freq: Once | INTRAMUSCULAR | Status: AC
  Administered 2024-03-05: 300 ug via INTRAVENOUS
  Filled 2024-03-05: qty 2

## 2024-03-05 MED ORDER — FAMOTIDINE 20 MG PO TABS
20.0000 mg | ORAL_TABLET | Freq: Two times a day (BID) | ORAL | Status: DC
  Administered 2024-03-05 – 2024-03-06 (×2): 20 mg via ORAL
  Filled 2024-03-05 (×2): qty 1

## 2024-03-05 MED ORDER — FERROUS SULFATE 325 (65 FE) MG PO TABS
325.0000 mg | ORAL_TABLET | ORAL | Status: DC
  Administered 2024-03-05: 325 mg via ORAL
  Filled 2024-03-05: qty 1

## 2024-03-05 MED ORDER — PANTOPRAZOLE SODIUM 40 MG PO TBEC
40.0000 mg | DELAYED_RELEASE_TABLET | Freq: Every day | ORAL | Status: DC
  Administered 2024-03-05 – 2024-03-06 (×2): 40 mg via ORAL
  Filled 2024-03-05: qty 1

## 2024-03-05 NOTE — Lactation Note (Signed)
 This note was copied from a baby's chart. Lactation Consultation Note  Patient Name: Ashley Browning Unijb'd Date: 03/05/2024 Age:41 hours Reason for consult: 1st time breastfeeding;Follow-up assessment (Infant weight loss -0.99%) P1, Per MOB, infant breastfeed for 20-25 minutes with NS, MOB has not been using the hand pump nor the DEBP. MOB nipples are evert but not very compressible due to areola edema. MOB informed LC infant recently breastfeed for 20 minutes but LC observed infant was still cuing to fed. MOB was open to re-latching infant to breast without the NS. MOB latched infant on her right breast with pillow support using the cross cradle hold, infant was off and on the breast but improving her latch during the feeding, MOB was still breastfeeding after 7 minutes when LC left the room. MOB will continue to breastfeed infant by cues, 8-12 times with  24 hours, skin to skin. MOB knows on Day 2 of life that infant is currently cluster feeding. MOB knows to call for further latch assistance if needed.   Maternal Data    Feeding Mother's Current Feeding Choice: Breast Milk  LATCH Score Latch: Repeated attempts needed to sustain latch, nipple held in mouth throughout feeding, stimulation needed to elicit sucking reflex. (Infant latched without NS)  Audible Swallowing: A few with stimulation  Type of Nipple: Everted at rest and after stimulation (slightly non-compressible due to areola edema.)  Comfort (Breast/Nipple): Soft / non-tender  Hold (Positioning): Assistance needed to correctly position infant at breast and maintain latch.  LATCH Score: 7   Lactation Tools Discussed/Used    Interventions    Discharge    Consult Status Consult Status: Follow-up Date: 03/06/24 Follow-up type: In-patient    Grayce LULLA Batter 03/05/2024, 8:18 PM

## 2024-03-05 NOTE — Anesthesia Postprocedure Evaluation (Signed)
"   Anesthesia Post Note  Patient: Ashley Browning  Procedure(s) Performed: AN AD HOC LABOR EPIDURAL     Patient location during evaluation: Mother Baby Anesthesia Type: Epidural Level of consciousness: awake Pain management: satisfactory to patient Vital Signs Assessment: post-procedure vital signs reviewed and stable Respiratory status: spontaneous breathing Cardiovascular status: stable Anesthetic complications: no   No notable events documented.  Last Vitals:  Vitals:   03/04/24 2331 03/05/24 0430  BP: (!) 104/59 127/70  Pulse: 80 64  Resp: 18   Temp: 36.9 C 37.1 C  SpO2: 99%     Last Pain:  Vitals:   03/05/24 0545  TempSrc:   PainSc: 4    Pain Goal:                   [name]      "

## 2024-03-05 NOTE — Progress Notes (Signed)
 Postpartum Progress Note  Post Partum Day 1 s/p VAVD.  Patient reports well-controlled pain, ambulating without difficulty, voiding spontaneously, tolerating PO.  Vaginal bleeding is appropriate.   Objective: Blood pressure 127/70, pulse 64, temperature 98.7 F (37.1 C), resp. rate 18, weight 86.8 kg, SpO2 99%, unknown if currently breastfeeding.  Physical Exam:  General: alert and no distress Lochia: appropriate Uterine Fundus: firm DVT Evaluation: No evidence of DVT seen on physical exam.  Recent Labs    03/04/24 0100 03/05/24 0436  HGB 11.6* 9.6*  HCT 34.7* 28.9*    Assessment/Plan: Postpartum Day 1, s/p vaginal delivery. Continue routine postpartum care Acute blood loss anemia, clinically significant - Fe/Colace. No signs or symptoms of anemia.  Lactation following Anticipate discharge home tomorrow   LOS: 1 day   Evalene DELENA Smiles 03/05/2024, 8:14 AM

## 2024-03-06 LAB — RH IG WORKUP (INCLUDES ABO/RH)
Fetal Screen: NEGATIVE
Gestational Age(Wks): 39.5
Unit division: 0

## 2024-03-06 NOTE — Discharge Summary (Signed)
 "    Postpartum Discharge Summary  Date of Service updated 03/06/24     Patient Name: Ashley Browning DOB: [birth year] MRN: [id number]  Date of admission: 03/04/2024 Delivery date:03/04/2024 Delivering provider: MARGET LENIS Date of discharge: 03/06/2024  Admitting diagnosis: Encounter for induction of labor [Z34.90] Intrauterine pregnancy: [redacted]w[redacted]d     Secondary diagnosis:  Principal Problem:   Encounter for induction of labor  Additional problems: None    Discharge diagnosis: Term Pregnancy Delivered                                              Post partum procedures:rhogam Augmentation: AROM, Pitocin , and Cytotec  Complications: None  Hospital course: Induction of Labor With Vaginal Delivery   39 y.o. yo G1P1001 at [redacted]w[redacted]d was admitted to the hospital 03/04/2024 for induction of labor.  Indication for induction: AMA.  Patient had an labor course complicated by none. Membrane Rupture Time/Date: 7:45 AM,03/04/2024  Delivery Method:Vaginal, Vacuum (Extractor) Operative Delivery:Device used:kiwi Indication: Maternal exhaustion Episiotomy: None Lacerations:  1st degree Details of delivery can be found in separate delivery note.  Patient had a postpartum course complicated by none. Patient is discharged home 03/06/24.  Newborn Data: Birth date:03/04/2024 Birth time:1:14 PM Gender:Female Living status:Living Apgars:8 ,9  Weight:3373 g  Magnesium Sulfate received: No BMZ received: No Rhophylac :Yes MMR:N/A T-DaP:Given prenatally  Immunizations administered: There is no immunization history for the selected administration types on file for this patient.  Physical exam  Vitals:   03/05/24 0430 03/05/24 1523 03/05/24 2046 03/06/24 0547  BP: 127/70 122/75 108/64 111/68  Pulse: 64 89 84 64  Resp:  17 17 18   Temp: 98.7 F (37.1 C) 98.6 F (37 C)    TempSrc:  Oral    SpO2:  100% 98% 98%  Weight:       General: alert, cooperative, and no distress Lochia: appropriate Uterine Fundus:  firm Incision: N/A DVT Evaluation: No evidence of DVT seen on physical exam. Labs: Lab Results  Component Value Date   WBC 15.3 (H) 03/05/2024   HGB 9.6 (L) 03/05/2024   HCT 28.9 (L) 03/05/2024   MCV 82.3 03/05/2024   PLT 236 03/05/2024       No data to display         Edinburgh Score:    03/06/2024    7:17 AM  Edinburgh Postnatal Depression Scale Screening Tool  I have been able to laugh and see the funny side of things. 1  I have looked forward with enjoyment to things. 0  I have blamed myself unnecessarily when things went wrong. 1  I have been anxious or worried for no good reason. 1  I have felt scared or panicky for no good reason. 1  Things have been getting on top of me. 1  I have been so unhappy that I have had difficulty sleeping. 0  I have felt sad or miserable. 1  I have been so unhappy that I have been crying. 0  The thought of harming myself has occurred to me. 0  Edinburgh Postnatal Depression Scale Total 6      After visit meds:  Allergies as of 03/06/2024       Reactions   Benadryl  [diphenhydramine ] Other (See Comments)   Restless leg syndrome    Clindamycin/lincomycin Hives        Medication List  TAKE these medications    multivitamin-prenatal 27-0.8 MG Tabs tablet Take 1 tablet by mouth daily at 12 noon.   omeprazole 40 MG capsule Commonly known as: PRILOSEC Take 40 mg by mouth daily.         Discharge home in stable condition Infant Feeding: Bottle and Breast Infant Disposition:home with mother Discharge instruction: per After Visit Summary and Postpartum booklet. Activity: Advance as tolerated. Pelvic rest for 6 weeks.  Diet: routine diet Anticipated Birth Control: Unsure Postpartum Appointment:6 weeks Additional Postpartum F/U: None Future Appointments:No future appointments. Follow up Visit:      03/06/2024 Kelly Delon Milian, MD   "

## 2024-03-06 NOTE — Plan of Care (Signed)
Education complete.

## 2024-03-06 NOTE — Lactation Note (Signed)
 This note was copied from a baby's chart. Lactation Consultation Note  Patient Name: Ashley Browning Unijb'd Date: 03/06/2024 Age:41 hours Reason for consult: Follow-up assessment;MD order;1st time breastfeeding;Term;Infant weight loss baby was latched when [facility] entered the room and then baby released. LC offered to assist and mom receptive. LC noted areola edema, reviewed and pre -pumped, and baby latched for another 10 mins with swallows.  LC reviewed steps for latching until the areola has less edema.  LC provided shells between feeding except when sleeping  and mom had been given a #20 NS  by a previous LC and has it if needed.  LC reviewed breast feeding D/C teaching and the [facility] resources.  Per mom has a hand pump and a double hands free when she goes home.   Maternal Data Has patient been taught Hand Expression?: Yes Does the patient have breastfeeding experience prior to this delivery?: No  Feeding Mother's Current Feeding Choice: Breast Milk  LATCH Score Latch: Grasps breast easily, tongue down, lips flanged, rhythmical sucking.  Audible Swallowing: A few with stimulation  Type of Nipple: Everted at rest and after stimulation  Comfort (Breast/Nipple): Soft / non-tender  Hold (Positioning): Assistance needed to correctly position infant at breast and maintain latch.  LATCH Score: 8   Lactation Tools Discussed/Used Tools: Shells;Pump;Flanges Nipple shield size: 20;Other (comment) (has it if needed) Flange Size: 18 Breast pump type: Manual Pump Education: Milk Storage;Setup, frequency, and cleaning  Interventions Interventions: Breast feeding basics reviewed;Assisted with latch;Skin to skin;Breast massage;Hand express;Pre-pump if needed;Reverse pressure;Breast compression;Adjust position;Support pillows;Expressed milk;Coconut oil;Shells;Hand pump;Education;LC Services brochure;CDC milk storage guidelines;CDC Guidelines for Breast Pump Cleaning  Discharge Discharge  Education: Engorgement and breast care;Warning signs for feeding baby;Outpatient recommendation (if needed) Pump: Personal;Hands Free;Manual  Consult Status Consult Status: Complete Date: 03/06/24    Rollene Jenkins Fiedler 03/06/2024, 9:10 AM

## 2024-03-12 ENCOUNTER — Telehealth (HOSPITAL_COMMUNITY): Payer: Self-pay | Admitting: *Deleted

## 2024-03-12 NOTE — Telephone Encounter (Signed)
 03/12/2024  Name: CHERMAINE SCHNYDER MRN: [id number] DOB: [birth year]  Reason for Call:  Transition of Care Hospital Discharge Call  Contact Status: Patient Contact Status: Message  Language assistant needed:          Follow-Up Questions:    Van Postnatal Depression Scale:  In the Past 7 Days:    PHQ2-9 Depression Scale:     Discharge Follow-up:    Post-discharge interventions: NA  Aristide Waggle,RN  03/12/2024 1621
